# Patient Record
Sex: Male | Born: 1980 | Race: White | Hispanic: No | Marital: Married | Smoking: Former smoker
Health system: Southern US, Community
[De-identification: ages and names within clinical notes are randomized; demographics above are authoritative.]

## PROBLEM LIST (undated history)

## (undated) DIAGNOSIS — K37 Unspecified appendicitis: Secondary | ICD-10-CM

## (undated) DIAGNOSIS — K0530 Chronic periodontitis, unspecified: Secondary | ICD-10-CM

## (undated) DIAGNOSIS — J45909 Unspecified asthma, uncomplicated: Secondary | ICD-10-CM

## (undated) DIAGNOSIS — K5090 Crohn's disease, unspecified, without complications: Secondary | ICD-10-CM

---

## 2002-03-23 ENCOUNTER — Emergency Department (HOSPITAL_COMMUNITY): Admission: EM | Admit: 2002-03-23 | Discharge: 2002-03-23 | Payer: Self-pay | Admitting: Emergency Medicine

## 2002-07-21 ENCOUNTER — Encounter: Payer: Self-pay | Admitting: Emergency Medicine

## 2002-07-21 ENCOUNTER — Emergency Department (HOSPITAL_COMMUNITY): Admission: EM | Admit: 2002-07-21 | Discharge: 2002-07-21 | Payer: Self-pay | Admitting: Emergency Medicine

## 2002-09-29 ENCOUNTER — Encounter: Payer: Self-pay | Admitting: Emergency Medicine

## 2002-09-29 ENCOUNTER — Emergency Department (HOSPITAL_COMMUNITY): Admission: EM | Admit: 2002-09-29 | Discharge: 2002-09-29 | Payer: Self-pay | Admitting: Emergency Medicine

## 2004-06-20 ENCOUNTER — Emergency Department (HOSPITAL_COMMUNITY): Admission: EM | Admit: 2004-06-20 | Discharge: 2004-06-20 | Payer: Self-pay | Admitting: Emergency Medicine

## 2015-04-23 ENCOUNTER — Encounter (HOSPITAL_COMMUNITY): Payer: Self-pay | Admitting: *Deleted

## 2015-04-23 ENCOUNTER — Emergency Department (HOSPITAL_COMMUNITY): Payer: PRIVATE HEALTH INSURANCE

## 2015-04-23 ENCOUNTER — Observation Stay (HOSPITAL_COMMUNITY): Payer: PRIVATE HEALTH INSURANCE | Admitting: Certified Registered Nurse Anesthetist

## 2015-04-23 ENCOUNTER — Observation Stay (HOSPITAL_COMMUNITY)
Admission: EM | Admit: 2015-04-23 | Discharge: 2015-04-24 | Disposition: A | Discharge status: Home / Self Care | Payer: PRIVATE HEALTH INSURANCE | Attending: General Surgery | Admitting: General Surgery

## 2015-04-23 ENCOUNTER — Encounter (HOSPITAL_COMMUNITY)
Admission: EM | Disposition: A | Discharge status: Home / Self Care | Payer: Self-pay | Source: Home / Self Care | Attending: Emergency Medicine

## 2015-04-23 DIAGNOSIS — E860 Dehydration: Secondary | ICD-10-CM | POA: Insufficient documentation

## 2015-04-23 DIAGNOSIS — K3580 Unspecified acute appendicitis: Principal | ICD-10-CM | POA: Diagnosis present

## 2015-04-23 DIAGNOSIS — F17210 Nicotine dependence, cigarettes, uncomplicated: Secondary | ICD-10-CM | POA: Insufficient documentation

## 2015-04-23 DIAGNOSIS — R109 Unspecified abdominal pain: Secondary | ICD-10-CM

## 2015-04-23 DIAGNOSIS — Z880 Allergy status to penicillin: Secondary | ICD-10-CM | POA: Insufficient documentation

## 2015-04-23 HISTORY — PX: LAPAROSCOPIC APPENDECTOMY: SHX408

## 2015-04-23 HISTORY — DX: Unspecified asthma, uncomplicated: J45.909

## 2015-04-23 HISTORY — DX: Chronic periodontitis, unspecified: K05.30

## 2015-04-23 HISTORY — PX: APPENDECTOMY: SHX54

## 2015-04-23 LAB — COMPREHENSIVE METABOLIC PANEL
ALBUMIN: 4.5 g/dL (ref 3.5–5.0)
ALT: 16 U/L — AB (ref 17–63)
ANION GAP: 12 (ref 5–15)
AST: 20 U/L (ref 15–41)
Alkaline Phosphatase: 65 U/L (ref 38–126)
BUN: 15 mg/dL (ref 6–20)
CHLORIDE: 95 mmol/L — AB (ref 101–111)
CO2: 29 mmol/L (ref 22–32)
CREATININE: 0.96 mg/dL (ref 0.61–1.24)
Calcium: 9.7 mg/dL (ref 8.9–10.3)
Glucose, Bld: 130 mg/dL — ABNORMAL HIGH (ref 65–99)
POTASSIUM: 3.1 mmol/L — AB (ref 3.5–5.1)
SODIUM: 136 mmol/L (ref 135–145)
Total Bilirubin: 0.6 mg/dL (ref 0.3–1.2)
Total Protein: 7.3 g/dL (ref 6.5–8.1)

## 2015-04-23 LAB — CBC
HCT: 42.1 % (ref 39.0–52.0)
HEMOGLOBIN: 15 g/dL (ref 13.0–17.0)
MCH: 33.9 pg (ref 26.0–34.0)
MCHC: 35.6 g/dL (ref 30.0–36.0)
MCV: 95 fL (ref 78.0–100.0)
PLATELETS: 194 10*3/uL (ref 150–400)
RBC: 4.43 MIL/uL (ref 4.22–5.81)
RDW: 12.7 % (ref 11.5–15.5)
WBC: 17.3 10*3/uL — AB (ref 4.0–10.5)

## 2015-04-23 LAB — URINALYSIS, ROUTINE W REFLEX MICROSCOPIC
Bilirubin Urine: NEGATIVE
GLUCOSE, UA: NEGATIVE mg/dL
Hgb urine dipstick: NEGATIVE
Ketones, ur: 15 mg/dL — AB
LEUKOCYTES UA: NEGATIVE
Nitrite: NEGATIVE
PROTEIN: 30 mg/dL — AB
SPECIFIC GRAVITY, URINE: 1.023 (ref 1.005–1.030)
pH: 7 (ref 5.0–8.0)

## 2015-04-23 LAB — URINE MICROSCOPIC-ADD ON
RBC / HPF: NONE SEEN RBC/hpf (ref 0–5)
SQUAMOUS EPITHELIAL / LPF: NONE SEEN

## 2015-04-23 LAB — I-STAT CG4 LACTIC ACID, ED
LACTIC ACID, VENOUS: 3.05 mmol/L — AB (ref 0.5–2.0)
LACTIC ACID, VENOUS: 1.32 mmol/L (ref 0.5–2.0)

## 2015-04-23 LAB — LIPASE, BLOOD: LIPASE: 23 U/L (ref 11–51)

## 2015-04-23 SURGERY — APPENDECTOMY, LAPAROSCOPIC
Anesthesia: General

## 2015-04-23 MED ORDER — ONDANSETRON HCL 4 MG/2ML IJ SOLN
INTRAMUSCULAR | Status: DC | PRN
  Administered 2015-04-23: 4 mg via INTRAVENOUS

## 2015-04-23 MED ORDER — PHENYLEPHRINE HCL 10 MG/ML IJ SOLN
10.0000 mg | INTRAVENOUS | Status: DC | PRN
  Administered 2015-04-23: 25 ug/min via INTRAVENOUS

## 2015-04-23 MED ORDER — SUCCINYLCHOLINE CHLORIDE 20 MG/ML IJ SOLN
INTRAMUSCULAR | Status: DC | PRN
  Administered 2015-04-23: 140 mg via INTRAVENOUS

## 2015-04-23 MED ORDER — ONDANSETRON HCL 4 MG/2ML IJ SOLN: 4.0000 mg | Freq: Four times a day (QID) | INTRAMUSCULAR | Status: DC | PRN

## 2015-04-23 MED ORDER — SODIUM CHLORIDE 0.9 % IV BOLUS (SEPSIS)
1000.0000 mL | Freq: Once | INTRAVENOUS | Status: AC
  Administered 2015-04-23: 1000 mL via INTRAVENOUS

## 2015-04-23 MED ORDER — FENTANYL CITRATE (PF) 250 MCG/5ML IJ SOLN
INTRAMUSCULAR | Status: AC
  Filled 2015-04-23: qty 5

## 2015-04-23 MED ORDER — ENOXAPARIN SODIUM 40 MG/0.4ML ~~LOC~~ SOLN
40.0000 mg | SUBCUTANEOUS | Status: DC
  Filled 2015-04-23: qty 0.4

## 2015-04-23 MED ORDER — MIDAZOLAM HCL 2 MG/2ML IJ SOLN
INTRAMUSCULAR | Status: AC
  Filled 2015-04-23: qty 2

## 2015-04-23 MED ORDER — EPHEDRINE SULFATE 50 MG/ML IJ SOLN
INTRAMUSCULAR | Status: DC | PRN
  Administered 2015-04-23: 5 mg via INTRAVENOUS

## 2015-04-23 MED ORDER — OXYCODONE HCL 5 MG PO TABS
10.0000 mg | ORAL_TABLET | ORAL | Status: DC | PRN
  Administered 2015-04-23: 10 mg via ORAL
  Administered 2015-04-24: 15 mg via ORAL
  Administered 2015-04-24: 10 mg via ORAL
  Filled 2015-04-23: qty 3
  Filled 2015-04-23 (×2): qty 2

## 2015-04-23 MED ORDER — EPHEDRINE SULFATE 50 MG/ML IJ SOLN
INTRAMUSCULAR | Status: AC
  Filled 2015-04-23: qty 1

## 2015-04-23 MED ORDER — POTASSIUM CHLORIDE 10 MEQ/100ML IV SOLN
10.0000 meq | INTRAVENOUS | Status: AC
Start: 2015-04-23 — End: 2015-04-23

## 2015-04-23 MED ORDER — DIPHENHYDRAMINE HCL 25 MG PO CAPS: 25.0000 mg | ORAL_CAPSULE | Freq: Four times a day (QID) | ORAL | Status: DC | PRN

## 2015-04-23 MED ORDER — HYDROMORPHONE HCL 1 MG/ML IJ SOLN
0.2500 mg | INTRAMUSCULAR | Status: DC | PRN
  Administered 2015-04-23 (×2): 0.5 mg via INTRAVENOUS

## 2015-04-23 MED ORDER — PHENYLEPHRINE 40 MCG/ML (10ML) SYRINGE FOR IV PUSH (FOR BLOOD PRESSURE SUPPORT)
PREFILLED_SYRINGE | INTRAVENOUS | Status: AC
  Filled 2015-04-23: qty 10

## 2015-04-23 MED ORDER — LIDOCAINE HCL (CARDIAC) 20 MG/ML IV SOLN
INTRAVENOUS | Status: DC | PRN
  Administered 2015-04-23: 40 mg via INTRAVENOUS

## 2015-04-23 MED ORDER — MORPHINE SULFATE (PF) 4 MG/ML IV SOLN
4.0000 mg | Freq: Once | INTRAVENOUS | Status: AC
  Administered 2015-04-23: 4 mg via INTRAVENOUS
  Filled 2015-04-23: qty 1

## 2015-04-23 MED ORDER — ONDANSETRON HCL 4 MG/2ML IJ SOLN
INTRAMUSCULAR | Status: AC
  Filled 2015-04-23: qty 2

## 2015-04-23 MED ORDER — IOHEXOL 300 MG/ML  SOLN
100.0000 mL | Freq: Once | INTRAMUSCULAR | Status: AC | PRN
  Administered 2015-04-23: 100 mL via INTRAVENOUS

## 2015-04-23 MED ORDER — GLYCOPYRROLATE 0.2 MG/ML IJ SOLN
INTRAMUSCULAR | Status: DC | PRN
  Administered 2015-04-23: .4 mg via INTRAVENOUS

## 2015-04-23 MED ORDER — DIPHENHYDRAMINE HCL 50 MG/ML IJ SOLN: 25.0000 mg | Freq: Four times a day (QID) | INTRAMUSCULAR | Status: DC | PRN

## 2015-04-23 MED ORDER — DEXAMETHASONE SODIUM PHOSPHATE 4 MG/ML IJ SOLN
INTRAMUSCULAR | Status: AC
  Filled 2015-04-23: qty 1

## 2015-04-23 MED ORDER — PROMETHAZINE HCL 25 MG/ML IJ SOLN: 6.2500 mg | INTRAMUSCULAR | Status: DC | PRN

## 2015-04-23 MED ORDER — ARTIFICIAL TEARS OP OINT
TOPICAL_OINTMENT | OPHTHALMIC | Status: AC
  Filled 2015-04-23: qty 3.5

## 2015-04-23 MED ORDER — PHENYLEPHRINE HCL 10 MG/ML IJ SOLN
INTRAMUSCULAR | Status: DC | PRN
  Administered 2015-04-23 (×5): 80 ug via INTRAVENOUS

## 2015-04-23 MED ORDER — ONDANSETRON 4 MG PO TBDP: 4.0000 mg | ORAL_TABLET | Freq: Four times a day (QID) | ORAL | Status: DC | PRN

## 2015-04-23 MED ORDER — LACTATED RINGERS IV SOLN
INTRAVENOUS | Status: DC
  Administered 2015-04-24: 01:00:00 via INTRAVENOUS

## 2015-04-23 MED ORDER — SUCCINYLCHOLINE CHLORIDE 20 MG/ML IJ SOLN
INTRAMUSCULAR | Status: AC
  Filled 2015-04-23: qty 1

## 2015-04-23 MED ORDER — NEOSTIGMINE METHYLSULFATE 10 MG/10ML IV SOLN
INTRAVENOUS | Status: DC | PRN
  Administered 2015-04-23: 3 mg via INTRAVENOUS

## 2015-04-23 MED ORDER — POTASSIUM CHLORIDE CRYS ER 20 MEQ PO TBCR
40.0000 meq | EXTENDED_RELEASE_TABLET | Freq: Once | ORAL | Status: AC
  Administered 2015-04-23: 40 meq via ORAL
  Filled 2015-04-23: qty 2

## 2015-04-23 MED ORDER — NEOSTIGMINE METHYLSULFATE 10 MG/10ML IV SOLN
INTRAVENOUS | Status: AC
  Filled 2015-04-23: qty 1

## 2015-04-23 MED ORDER — HYDROMORPHONE HCL 1 MG/ML IJ SOLN
1.0000 mg | INTRAMUSCULAR | Status: DC | PRN
  Administered 2015-04-23: 1 mg via INTRAVENOUS
  Filled 2015-04-23: qty 1

## 2015-04-23 MED ORDER — METRONIDAZOLE IN NACL 5-0.79 MG/ML-% IV SOLN
500.0000 mg | Freq: Four times a day (QID) | INTRAVENOUS | Status: DC
  Administered 2015-04-23: 500 mg via INTRAVENOUS
  Filled 2015-04-23 (×3): qty 100

## 2015-04-23 MED ORDER — LIDOCAINE HCL (CARDIAC) 20 MG/ML IV SOLN
INTRAVENOUS | Status: AC
  Filled 2015-04-23: qty 5

## 2015-04-23 MED ORDER — BUPIVACAINE-EPINEPHRINE 0.25% -1:200000 IJ SOLN
INTRAMUSCULAR | Status: DC | PRN
  Administered 2015-04-23: 10 mL

## 2015-04-23 MED ORDER — ONDANSETRON HCL 4 MG/2ML IJ SOLN
4.0000 mg | INTRAMUSCULAR | Status: AC
  Administered 2015-04-23: 4 mg via INTRAVENOUS
  Filled 2015-04-23: qty 2

## 2015-04-23 MED ORDER — ROCURONIUM BROMIDE 50 MG/5ML IV SOLN
INTRAVENOUS | Status: AC
  Filled 2015-04-23: qty 1

## 2015-04-23 MED ORDER — LACTATED RINGERS IV SOLN
INTRAVENOUS | Status: DC
  Administered 2015-04-23 (×2): via INTRAVENOUS

## 2015-04-23 MED ORDER — SODIUM CHLORIDE 0.9 % IV BOLUS (SEPSIS): 1000.0000 mL | Freq: Once | INTRAVENOUS | Status: DC

## 2015-04-23 MED ORDER — FENTANYL CITRATE (PF) 100 MCG/2ML IJ SOLN
INTRAMUSCULAR | Status: DC | PRN
  Administered 2015-04-23: 50 ug via INTRAVENOUS
  Administered 2015-04-23 (×2): 100 ug via INTRAVENOUS

## 2015-04-23 MED ORDER — CIPROFLOXACIN IN D5W 400 MG/200ML IV SOLN
400.0000 mg | Freq: Two times a day (BID) | INTRAVENOUS | Status: DC
  Administered 2015-04-23: 400 mg via INTRAVENOUS
  Filled 2015-04-23 (×2): qty 200

## 2015-04-23 MED ORDER — METRONIDAZOLE IN NACL 5-0.79 MG/ML-% IV SOLN
500.0000 mg | Freq: Three times a day (TID) | INTRAVENOUS | Status: DC
  Administered 2015-04-24 (×2): 500 mg via INTRAVENOUS
  Filled 2015-04-23 (×3): qty 100

## 2015-04-23 MED ORDER — ROCURONIUM BROMIDE 100 MG/10ML IV SOLN
INTRAVENOUS | Status: DC | PRN
  Administered 2015-04-23: 20 mg via INTRAVENOUS

## 2015-04-23 MED ORDER — PROPOFOL 10 MG/ML IV BOLUS
INTRAVENOUS | Status: DC | PRN
  Administered 2015-04-23: 180 mg via INTRAVENOUS

## 2015-04-23 MED ORDER — HYDROMORPHONE HCL 1 MG/ML IJ SOLN
1.0000 mg | INTRAMUSCULAR | Status: DC | PRN
  Administered 2015-04-24 (×2): 1 mg via INTRAVENOUS
  Filled 2015-04-23 (×2): qty 1

## 2015-04-23 MED ORDER — HYDROMORPHONE HCL 1 MG/ML IJ SOLN
INTRAMUSCULAR | Status: AC
  Administered 2015-04-23: 0.5 mg via INTRAVENOUS
  Filled 2015-04-23: qty 1

## 2015-04-23 MED ORDER — DIPHENHYDRAMINE HCL 50 MG/ML IJ SOLN
25.0000 mg | Freq: Once | INTRAMUSCULAR | Status: AC
  Administered 2015-04-23: 25 mg via INTRAVENOUS
  Filled 2015-04-23: qty 1

## 2015-04-23 MED ORDER — PROPOFOL 10 MG/ML IV BOLUS
INTRAVENOUS | Status: AC
  Filled 2015-04-23: qty 40

## 2015-04-23 MED ORDER — GLYCOPYRROLATE 0.2 MG/ML IJ SOLN
INTRAMUSCULAR | Status: AC
  Filled 2015-04-23: qty 1

## 2015-04-23 MED ORDER — SODIUM CHLORIDE 0.9 % IJ SOLN
INTRAMUSCULAR | Status: AC
  Filled 2015-04-23: qty 10

## 2015-04-23 MED ORDER — LACTATED RINGERS IV SOLN: INTRAVENOUS | Status: DC

## 2015-04-23 MED ORDER — BUPIVACAINE-EPINEPHRINE (PF) 0.25% -1:200000 IJ SOLN
INTRAMUSCULAR | Status: AC
  Filled 2015-04-23: qty 30

## 2015-04-23 MED ORDER — SODIUM CHLORIDE 0.9 % IR SOLN
Status: DC | PRN
  Administered 2015-04-23: 1000 mL

## 2015-04-23 SURGICAL SUPPLY — 48 items
ADH SKN CLS APL DERMABOND .7 (GAUZE/BANDAGES/DRESSINGS) ×1
APPLIER CLIP ROT 10 11.4 M/L (STAPLE)
APR CLP MED LRG 11.4X10 (STAPLE)
BAG SPEC RTRVL LRG 6X4 10 (ENDOMECHANICALS) ×1
BLADE SURG ROTATE 9660 (MISCELLANEOUS) IMPLANT
CANISTER SUCTION 2500CC (MISCELLANEOUS) ×3 IMPLANT
CHLORAPREP W/TINT 26ML (MISCELLANEOUS) ×3 IMPLANT
CLIP APPLIE ROT 10 11.4 M/L (STAPLE) IMPLANT
CLOSURE WOUND 1/2 X4 (GAUZE/BANDAGES/DRESSINGS) ×1
COVER SURGICAL LIGHT HANDLE (MISCELLANEOUS) ×7 IMPLANT
CUTTER LINEAR ENDO 35 ART FLEX (STAPLE) ×2 IMPLANT
CUTTER LINEAR ENDO 35 ETS (STAPLE) IMPLANT
DERMABOND ADVANCED (GAUZE/BANDAGES/DRESSINGS) ×2
DERMABOND ADVANCED .7 DNX12 (GAUZE/BANDAGES/DRESSINGS) ×1 IMPLANT
DRSG TEGADERM 2-3/8X2-3/4 SM (GAUZE/BANDAGES/DRESSINGS) ×7 IMPLANT
ELECT REM PT RETURN 9FT ADLT (ELECTROSURGICAL) ×3
ELECTRODE REM PT RTRN 9FT ADLT (ELECTROSURGICAL) ×1 IMPLANT
ENDOLOOP SUT PDS II  0 18 (SUTURE)
ENDOLOOP SUT PDS II 0 18 (SUTURE) IMPLANT
GLOVE BIO SURGEON STRL SZ7 (GLOVE) ×2 IMPLANT
GLOVE BIOGEL PI IND STRL 8 (GLOVE) ×1 IMPLANT
GLOVE BIOGEL PI INDICATOR 8 (GLOVE) ×4
GLOVE ECLIPSE 7.5 STRL STRAW (GLOVE) ×7 IMPLANT
GOWN STRL REUS W/ TWL LRG LVL3 (GOWN DISPOSABLE) ×3 IMPLANT
GOWN STRL REUS W/TWL LRG LVL3 (GOWN DISPOSABLE) ×9
KIT BASIN OR (CUSTOM PROCEDURE TRAY) ×3 IMPLANT
KIT ROOM TURNOVER OR (KITS) ×3 IMPLANT
NS IRRIG 1000ML POUR BTL (IV SOLUTION) ×3 IMPLANT
PAD ARMBOARD 7.5X6 YLW CONV (MISCELLANEOUS) ×6 IMPLANT
PENCIL BUTTON HOLSTER BLD 10FT (ELECTRODE) IMPLANT
POUCH SPECIMEN RETRIEVAL 10MM (ENDOMECHANICALS) ×3 IMPLANT
RELOAD /EVU35 (ENDOMECHANICALS) IMPLANT
RELOAD CUTTER ETS 35MM STAND (ENDOMECHANICALS) ×4 IMPLANT
SCALPEL HARMONIC ACE (MISCELLANEOUS) ×3 IMPLANT
SET IRRIG TUBING LAPAROSCOPIC (IRRIGATION / IRRIGATOR) ×3 IMPLANT
SHEARS HARMONIC ACE PLUS 36CM (ENDOMECHANICALS) ×2 IMPLANT
SLEEVE ENDOPATH XCEL 5M (ENDOMECHANICALS) ×5 IMPLANT
SPECIMEN JAR SMALL (MISCELLANEOUS) ×3 IMPLANT
STRIP CLOSURE SKIN 1/2X4 (GAUZE/BANDAGES/DRESSINGS) ×2 IMPLANT
SUT MNCRL AB 4-0 PS2 18 (SUTURE) ×3 IMPLANT
TOWEL OR 17X24 6PK STRL BLUE (TOWEL DISPOSABLE) ×3 IMPLANT
TOWEL OR 17X26 10 PK STRL BLUE (TOWEL DISPOSABLE) ×3 IMPLANT
TRAY FOLEY CATH 16FR SILVER (SET/KITS/TRAYS/PACK) ×3 IMPLANT
TRAY LAPAROSCOPIC MC (CUSTOM PROCEDURE TRAY) ×3 IMPLANT
TROCAR BLADELESS 5MM (ENDOMECHANICALS) ×2 IMPLANT
TROCAR XCEL BLUNT TIP 100MML (ENDOMECHANICALS) ×3 IMPLANT
TROCAR XCEL NON-BLD 5MMX100MML (ENDOMECHANICALS) ×3 IMPLANT
TUBING INSUFFLATION (TUBING) ×3 IMPLANT

## 2015-04-23 NOTE — Op Note (Signed)
OPERATIVE REPORT  DATE OF OPERATION: 04/23/2015  PATIENT:  Paul Arnold  34 y.o. male  PRE-OPERATIVE DIAGNOSIS:  Acute appendicitis  POST-OPERATIVE DIAGNOSIS:  SAME  PROCEDURE:  Procedure(s): APPENDECTOMY LAPAROSCOPIC  SURGEON:  Surgeon(s): Judeth Horn, MD  ASSISTANT: None  ANESTHESIA:   general  EBL: <20 ml  BLOOD ADMINISTERED: none  DRAINS: none   SPECIMEN:  Source of Specimen:  Appendix  COUNTS CORRECT:  YES  PROCEDURE DETAILS: The patient was taken to the operating room and placed on the table in supine position. After an adequate general endotracheal anesthetic was administered he was prepped and draped in usual sterile manner exposing the abdomen.  A proper timeout was performed identifying the patient and procedure to be performed.  A supraumbilical midline incision was made using a #15 blade and taken down to the midline fascia. We entered the peritoneal cavity at the midline fascia securing in place a 12 mm Hassan cannula with a pursestring suture of 0 Vicryl. We insufflated carbon dioxide gas up to a maximal pressure of 15 mmHg. Under direct vision with the laparoscope and attached camera and light source, right upper quadrant and a left low quadrant 5 mm cannula pads put in place. The patient was placed in Trendelenburg and the left side was tilted down.  The patient had a very short appendix which we dissected out at the base a Harmonic Scalpel. We came across the base of the appendix with the blue cartridge Endo GIA, however because of misfiring the appendix was detached but no staple line was placed.  We subsequently had use a second cartridge in order to come across the base of the appendix/cecum and removed the pieces with an Endo Catch bag. There was no appendiceal or fecal spillage during this time.  During the dissection of the appendix there was a serosal tear--not a full thickness enterotomy-- of the cecal wall which we repaired using a blue cartridge  Endo GIA also. This was a sidewall bite which did not cause any full-thickness spillage or injury.  We irrigated with saline solution then close out the super umbilical fascial side using the pursestring suture which was in place. We aspirated all fluid and gas.  We closed the supraumbilical incision with a running subcuticular stitch of 4-0 Monocryl.  0.25% Marcaine with epinephrine was injected all sites. We closed the skin at the super umbilical site using a running subcuticular stitch of 4-0 Monocryl. Dermabond Steri-Strips and Tegaderms are used to complete the dressings. All needle counts, sponge counts, and his main counts were correct.  PATIENT DISPOSITION:  PACU - hemodynamically stable.   [name] 12/5/20166:25 PM

## 2015-04-23 NOTE — ED Notes (Signed)
Dr. Hulen Skains in to assess pt at this time

## 2015-04-23 NOTE — ED Notes (Signed)
Called to triage x 1

## 2015-04-23 NOTE — ED Provider Notes (Signed)
CSN: [id number]     Arrival date & time 04/23/15  0940 History   First MD Initiated Contact with Patient 04/23/15 1016     Chief Complaint  Patient presents with  . Emesis  . Abdominal Pain    HPI  Paul Arnold is a 34 y.o. male with no pertinent PMH who presents to the ED with intermittent abdominal pain, nausea, and vomiting x 6 months. He reports his symptoms have been more severe over the past 3 days. He states eating or drinking exacerbates his symptoms. He has not tried anything for symptom relief. He reports associated decreased appetite, subjective fever, chills, diarrhea. He states he has vomited approximately 20 times in the past 24 hours.   Past Medical History  Diagnosis Date  . Periodontitis    Past Surgical History  Procedure Laterality Date  . No past surgeries     History reviewed. No pertinent family history. Social History  Substance Use Topics  . Smoking status: Current Every Day Smoker -- 0.50 packs/day    Types: Cigarettes  . Smokeless tobacco: None  . Alcohol Use: No      Review of Systems  Constitutional: Positive for fever and chills.  Gastrointestinal: Positive for nausea, vomiting, abdominal pain and diarrhea. Negative for constipation and blood in stool.  Genitourinary: Negative for dysuria, urgency, frequency, discharge, penile swelling, scrotal swelling, penile pain and testicular pain.  All other systems reviewed and are negative.     Allergies  Penicillins  Home Medications   Prior to Admission medications   Medication Sig Start Date End Date Taking? Authorizing Provider  acetaminophen (TYLENOL) 500 MG tablet Take 1,000 mg by mouth every 6 (six) hours as needed for mild pain.   Yes Historical Provider, MD  clindamycin (CLEOCIN) 300 MG capsule Take 300 mg by mouth 3 (three) times daily.   Yes Historical Provider, MD    BP 103/65 mmHg  Pulse 88  Temp(Src) 98.6 F (37 C) (Oral)  Resp 18  SpO2 100% Physical Exam   Constitutional: He is oriented to person, place, and time. He appears well-developed and well-nourished. No distress.  HENT:  Head: Normocephalic and atraumatic.  Right Ear: External ear normal.  Left Ear: External ear normal.  Nose: Nose normal.  Mouth/Throat: Uvula is midline, oropharynx is clear and moist and mucous membranes are normal.  Eyes: Conjunctivae, EOM and lids are normal. Pupils are equal, round, and reactive to light. Right eye exhibits no discharge. Left eye exhibits no discharge. No scleral icterus.  Neck: Normal range of motion. Neck supple.  Cardiovascular: Normal rate, regular rhythm, normal heart sounds, intact distal pulses and normal pulses.   Pulmonary/Chest: Effort normal and breath sounds normal. No respiratory distress. He has no wheezes. He has no rales.  Abdominal: Soft. Normal appearance and bowel sounds are normal. He exhibits no distension and no mass. There is tenderness. There is no rigidity, no rebound and no guarding.  Diffuse TTP. No rebound, guarding, or masses.   Musculoskeletal: Normal range of motion. He exhibits no edema or tenderness.  Neurological: He is alert and oriented to person, place, and time. He has normal strength. No sensory deficit.  Skin: Skin is warm, dry and intact. No rash noted. He is not diaphoretic. No erythema. No pallor.  Psychiatric: He has a normal mood and affect. His speech is normal and behavior is normal.  Nursing note and vitals reviewed.   ED Course  Procedures (including critical care time)  Labs Review Labs  Reviewed  COMPREHENSIVE METABOLIC PANEL - Abnormal; Notable for the following:    Potassium 3.1 (*)    Chloride 95 (*)    Glucose, Bld 130 (*)    ALT 16 (*)    All other components within normal limits  CBC - Abnormal; Notable for the following:    WBC 17.3 (*)    All other components within normal limits  URINALYSIS, ROUTINE W REFLEX MICROSCOPIC (NOT AT [facility]) - Abnormal; Notable for the following:     Ketones, ur 15 (*)    Protein, ur 30 (*)    All other components within normal limits  URINE MICROSCOPIC-ADD ON - Abnormal; Notable for the following:    Bacteria, UA RARE (*)    Casts HYALINE CASTS (*)    All other components within normal limits  I-STAT CG4 LACTIC ACID, ED - Abnormal; Notable for the following:    Lactic Acid, Venous 3.05 (*)    All other components within normal limits  LIPASE, BLOOD  I-STAT CG4 LACTIC ACID, ED    Imaging Review Ct Abdomen Pelvis W Contrast  04/23/2015  CLINICAL DATA:  Nausea and vomiting for 6 months with increased over the past 3 days EXAM: CT ABDOMEN AND PELVIS WITH CONTRAST TECHNIQUE: Multidetector CT imaging of the abdomen and pelvis was performed using the standard protocol following bolus administration of intravenous contrast. CONTRAST:  125m OMNIPAQUE IOHEXOL 300 MG/ML  SOLN COMPARISON:  08/11/2012 FINDINGS: Lung bases are free of acute infiltrate or sizable effusion. The liver, gallbladder, spleen, adrenal glands and pancreas are within normal limits. Kidneys are well visualized and reveal a normal enhancement pattern. No renal calculi or obstructive changes are noted. The appendix is well visualized and mildly prominent at 9.5 mm. A small appendicolith is noted within. These changes could represent some very early appendicitis. Correlation with the physical exam is recommended. The bladder is partially distended. No pelvic mass lesion is seen. No free pelvic fluid is noted. The osseous structures are within normal limits. IMPRESSION: Questionable prominence of the appendix. Correlation with the physical exam is recommended. Short-term followup CT may be helpful. No other focal abnormality is noted. Electronically Signed   By: MInez CatalinaM.D.   On: 04/23/2015 15:19     I have personally reviewed and evaluated these images and lab results as part of my medical decision-making.   EKG Interpretation None      MDM   Final diagnoses:   Abdominal pain  Abdominal pain    34year old male presents with abdominal pain, nausea, vomiting for the past 6 months. He reports associated diarrhea, subjective fever, and chills. Patient admits to poor PO intake with acute worsening of his symptoms x 3 days.  Patient is afebrile. BP in 933H-545Gsystolic. Heart regular rate and rhythm. Lungs clear to auscultation bilaterally. Abdomen soft, nondistended, with mild diffuse tenderness to palpation. No rebound, guarding, or masses.  Patient given 1L NS bolus x 2, antiemetic, and pain medication.  CBC remarkable for leukocytosis of 17.3. CMP remarkable for potassium 3.1, repleted in the ED. Lipase within normal limits. Lactic acid elevated at 3.05. Given additional 1L bolus NS. Will obtain CT abdomen pelvis. Imaging remarkable for prominence of the appendix. On reassessment, patient has diffuse TTP, with significant TTP in RLQ. Surgery consulted. Dr. WHulen Skainsevaluated the patient in the ED, patient to go to surgery.  Patient discussed with and seen by Dr. SBilly Fischer  BP 103/65 mmHg  Pulse 88  Temp(Src) 98.6 F (37 C) (Oral)  Resp 18  SpO2 100%   Marella Chimes, PA-C 04/23/15 1943  Gareth Morgan, MD 04/23/15 2241

## 2015-04-23 NOTE — Anesthesia Preprocedure Evaluation (Addendum)
Anesthesia Evaluation  Patient identified by MRN, date of birth, ID band Patient awake    Reviewed: Allergy & Precautions, NPO status , Patient's Chart, lab work & pertinent test results  Airway Mallampati: II  TM Distance: >3 FB Neck ROM: Full    Dental  (+) Poor Dentition, Missing, Loose, Dental Advisory Given, Chipped   Pulmonary Current Smoker,    Pulmonary exam normal        Cardiovascular Normal cardiovascular exam     Neuro/Psych negative neurological ROS  negative psych ROS   GI/Hepatic (+)     substance abuse  ,   Endo/Other  negative endocrine ROS  Renal/GU negative Renal ROS     Musculoskeletal   Abdominal   Peds  Hematology negative hematology ROS (+)   Anesthesia Other Findings   Reproductive/Obstetrics                            Anesthesia Physical Anesthesia Plan  ASA: II and emergent  Anesthesia Plan: General   Post-op Pain Management:    Induction: Intravenous  Airway Management Planned: Oral ETT  Additional Equipment:   Intra-op Plan:   Post-operative Plan: Extubation in OR  Informed Consent: I have reviewed the patients History and Physical, chart, labs and discussed the procedure including the risks, benefits and alternatives for the proposed anesthesia with the patient or authorized representative who has indicated his/her understanding and acceptance.   Dental advisory given  Plan Discussed with: CRNA, Anesthesiologist and Surgeon  Anesthesia Plan Comments:         Anesthesia Quick Evaluation

## 2015-04-23 NOTE — H&P (Signed)
Chief Complaint: nausea, vomiting and abdominal pain  HPI: Paul Arnold is a 34 year old male without significant past medical history presenting with worsening abdominal pain, nausea and vomiting.  He reports intermittent symptoms over the past 6 months, about 2-3 times per week.  It would not be aggravated by oral intake.  Since this time, he has been taking 10-11 ibuprofen per day along with tylenol 4 tablets every 3 hours several times per week.  He reports hematemesis about 6 months ago, but nothing recent.  He reports hematochezia several times noted in the toilet.    Over the last 3 days, he reports worsening pain periumbilical radiating to the right lower quadrant.  Associated with nausea, vomiting, fever, chills, sweats and anorexia.    His work up shows WBC 17.3k, K 3.1, normal renal function, negative UA. CT of abdomen and pelvis "questionable prominence of the appendix.  We have therefore been asked to evaluate.   History reviewed. No pertinent past medical history.  History reviewed. No pertinent past surgical history.  History reviewed. No pertinent family history. Social History:  reports that he has been smoking Cigarettes.  He does not have any smokeless tobacco history on file. He reports that he does not drink alcohol or use illicit drugs.  Allergies:  Allergies  Allergen Reactions  . Penicillins     Childhood allergy Has patient had a PCN reaction causing immediate rash, facial/tongue/throat swelling, SOB or lightheadedness with hypotension: unknown Has patient had a PCN reaction causing severe rash involving mucus membranes or skin necrosis: unknown Has patient had a PCN reaction that required hospitalization unknown Has patient had a PCN reaction occurring within the last 10 years: unknown If all of the above answers are "NO", then may proceed with Cephalosporin use.      (Not in a hospital admission)  Results for orders placed or performed during the  hospital encounter of 04/23/15 (from the past 48 hour(s))  Lipase, blood     Status: None   Collection Time: 04/23/15 10:50 AM  Result Value Ref Range   Lipase 23 11 - 51 U/L  Comprehensive metabolic panel     Status: Abnormal   Collection Time: 04/23/15 10:50 AM  Result Value Ref Range   Sodium 136 135 - 145 mmol/L   Potassium 3.1 (L) 3.5 - 5.1 mmol/L   Chloride 95 (L) 101 - 111 mmol/L   CO2 29 22 - 32 mmol/L   Glucose, Bld 130 (H) 65 - 99 mg/dL   BUN 15 6 - 20 mg/dL   Creatinine, Ser 0.96 0.61 - 1.24 mg/dL   Calcium 9.7 8.9 - 10.3 mg/dL   Total Protein 7.3 6.5 - 8.1 g/dL   Albumin 4.5 3.5 - 5.0 g/dL   AST 20 15 - 41 U/L   ALT 16 (L) 17 - 63 U/L   Alkaline Phosphatase 65 38 - 126 U/L   Total Bilirubin 0.6 0.3 - 1.2 mg/dL   GFR calc non Af Amer >60 >60 mL/min   GFR calc Af Amer >60 >60 mL/min    Comment: (NOTE) The eGFR has been calculated using the CKD EPI equation. This calculation has not been validated in all clinical situations. eGFR's persistently <60 mL/min signify possible Chronic Kidney Disease.    Anion gap 12 5 - 15  CBC     Status: Abnormal   Collection Time: 04/23/15 10:50 AM  Result Value Ref Range   WBC 17.3 (H) 4.0 - 10.5 K/uL   RBC  4.43 4.22 - 5.81 MIL/uL   Hemoglobin 15.0 13.0 - 17.0 g/dL   HCT 42.1 39.0 - 52.0 %   MCV 95.0 78.0 - 100.0 fL   MCH 33.9 26.0 - 34.0 pg   MCHC 35.6 30.0 - 36.0 g/dL   RDW 12.7 11.5 - 15.5 %   Platelets 194 150 - 400 K/uL  I-Stat CG4 Lactic Acid, ED     Status: Abnormal   Collection Time: 04/23/15 12:04 PM  Result Value Ref Range   Lactic Acid, Venous 3.05 (HH) 0.5 - 2.0 mmol/L   Comment NOTIFIED PHYSICIAN   Urinalysis, Routine w reflex microscopic (not at [facility])     Status: Abnormal   Collection Time: 04/23/15  2:40 PM  Result Value Ref Range   Color, Urine YELLOW YELLOW   APPearance CLEAR CLEAR   Specific Gravity, Urine 1.023 1.005 - 1.030   pH 7.0 5.0 - 8.0   Glucose, UA NEGATIVE NEGATIVE mg/dL   Hgb urine dipstick  NEGATIVE NEGATIVE   Bilirubin Urine NEGATIVE NEGATIVE   Ketones, ur 15 (A) NEGATIVE mg/dL   Protein, ur 30 (A) NEGATIVE mg/dL   Nitrite NEGATIVE NEGATIVE   Leukocytes, UA NEGATIVE NEGATIVE  Urine microscopic-add on     Status: Abnormal   Collection Time: 04/23/15  2:40 PM  Result Value Ref Range   Squamous Epithelial / LPF NONE SEEN NONE SEEN   WBC, UA 0-5 0 - 5 WBC/hpf   RBC / HPF NONE SEEN 0 - 5 RBC/hpf   Bacteria, UA RARE (A) NONE SEEN   Casts HYALINE CASTS (A) NEGATIVE   Urine-Other MUCOUS PRESENT    Ct Abdomen Pelvis W Contrast  04/23/2015  CLINICAL DATA:  Nausea and vomiting for 6 months with increased over the past 3 days EXAM: CT ABDOMEN AND PELVIS WITH CONTRAST TECHNIQUE: Multidetector CT imaging of the abdomen and pelvis was performed using the standard protocol following bolus administration of intravenous contrast. CONTRAST:  1110m OMNIPAQUE IOHEXOL 300 MG/ML  SOLN COMPARISON:  08/11/2012 FINDINGS: Lung bases are free of acute infiltrate or sizable effusion. The liver, gallbladder, spleen, adrenal glands and pancreas are within normal limits. Kidneys are well visualized and reveal a normal enhancement pattern. No renal calculi or obstructive changes are noted. The appendix is well visualized and mildly prominent at 9.5 mm. A small appendicolith is noted within. These changes could represent some very early appendicitis. Correlation with the physical exam is recommended. The bladder is partially distended. No pelvic mass lesion is seen. No free pelvic fluid is noted. The osseous structures are within normal limits. IMPRESSION: Questionable prominence of the appendix. Correlation with the physical exam is recommended. Short-term followup CT may be helpful. No other focal abnormality is noted. Electronically Signed   By: MInez CatalinaM.D.   On: 04/23/2015 15:19    Review of Systems  Constitutional: Positive for fever, chills, malaise/fatigue and diaphoresis. Negative for weight loss.   Eyes: Negative for blurred vision, double vision, photophobia, pain, discharge and redness.  Respiratory: Negative for cough, hemoptysis, sputum production, shortness of breath and wheezing.   Cardiovascular: Negative for chest pain, palpitations, orthopnea, claudication, leg swelling and PND.  Gastrointestinal: Positive for nausea, vomiting, abdominal pain and blood in stool. Negative for heartburn, diarrhea, constipation and melena.  Genitourinary: Positive for dysuria and flank pain. Negative for urgency, frequency and hematuria.  Musculoskeletal: Positive for back pain. Negative for myalgias.  Neurological: Negative for dizziness, tingling, tremors, sensory change, speech change, focal weakness, seizures, loss of consciousness,  weakness and headaches.  Psychiatric/Behavioral: Negative for substance abuse.    Blood pressure 95/59, pulse 92, temperature 98.1 F (36.7 C), temperature source Oral, resp. rate 18, SpO2 98 %. Physical Exam  Constitutional: He is oriented to person, place, and time. He appears well-developed and well-nourished. No distress.  Cardiovascular: Normal rate, regular rhythm, normal heart sounds and intact distal pulses.  Exam reveals no gallop and no friction rub.   No murmur heard. Respiratory: Effort normal and breath sounds normal. No respiratory distress. He has no wheezes. He has no rales. He exhibits no tenderness.  GI: Soft. Bowel sounds are normal. He exhibits no distension and no mass.  Diffusely tender, most appreciated RLQ. Diffuse voluntary guarding  Musculoskeletal: Normal range of motion. He exhibits no edema or tenderness.  Neurological: He is alert and oriented to person, place, and time.  Skin: Skin is warm and dry. He is not diaphoretic.  Urticaria   Psychiatric: He has a normal mood and affect. His behavior is normal. Judgment and thought content normal.     Assessment/Plan Appendicitis-proceed with laparoscopic appendectomy.  Surgical risks  discussed including infection, bleeding, injury to surrounding structures, anesthesia risks.  He understands that this may not alleviate his symptoms.  He verbalizes understanding and wishes to proceed. ID-cipro/flagyl Dehydration- lactic acid 3.05--->1.32.  IVF VTE prophylaxis-SCD, post op lovenox  FEN-NPO, give IVF bolus.  Hypokalemia-give 76mq IV now Dispo-to OR  Paul Arnold ANP-BC 04/23/2015, 3:46 PM

## 2015-04-23 NOTE — ED Notes (Signed)
Pt reports n/v and and pain x 6 months, more severe x 3 days and reports intermittent diarrhea over the past 6 months. Reports dehydration, not urinating x 1 day.

## 2015-04-23 NOTE — Transfer of Care (Signed)
Immediate Anesthesia Transfer of Care Note  Patient: Paul Arnold  Procedure(s) Performed: Procedure(s): APPENDECTOMY LAPAROSCOPIC (N/A)  Patient Location: PACU  Anesthesia Type:General  Level of Consciousness: awake, alert  and oriented  Airway & Oxygen Therapy: Patient Spontanous Breathing  Post-op Assessment: Report given to RN and Post -op Vital signs reviewed and stable  Post vital signs: Reviewed and stable  Last Vitals:  Filed Vitals:   04/23/15 1430 04/23/15 1615  BP: 95/59 103/65  Pulse: 92 88  Temp:  37 C  Resp:  18    Complications: No apparent anesthesia complications

## 2015-04-23 NOTE — Anesthesia Procedure Notes (Signed)
Procedure Name: Intubation Date/Time: 04/23/2015 5:26 PM Performed by: Merdis Delay Pre-anesthesia Checklist: Patient identified, Timeout performed, Emergency Drugs available, Suction available and Patient being monitored Patient Re-evaluated:Patient Re-evaluated prior to inductionOxygen Delivery Method: Circle system utilized Preoxygenation: Pre-oxygenation with 100% oxygen Intubation Type: IV induction and Rapid sequence Laryngoscope Size: Mac and 3 Grade View: Grade I Tube type: Oral Tube size: 7.5 mm Number of attempts: 1 Placement Confirmation: ETT inserted through vocal cords under direct vision,  breath sounds checked- equal and bilateral,  CO2 detector and positive ETCO2 Secured at: 22 cm Tube secured with: Tape Dental Injury: Teeth and Oropharynx as per pre-operative assessment

## 2015-04-23 NOTE — ED Notes (Signed)
OR consent signed by pt before pain meds given

## 2015-04-24 ENCOUNTER — Encounter (HOSPITAL_COMMUNITY): Payer: Self-pay | Admitting: General Practice

## 2015-04-24 MED ORDER — OXYCODONE HCL 10 MG PO TABS: 10.0000 mg | ORAL_TABLET | ORAL | Status: DC | PRN

## 2015-04-24 NOTE — Anesthesia Postprocedure Evaluation (Signed)
Anesthesia Post Note  Patient: Paul Arnold  Procedure(s) Performed: Procedure(s) (LRB): APPENDECTOMY LAPAROSCOPIC (N/A)  Patient location during evaluation: PACU Anesthesia Type: General Level of consciousness: sedated and patient cooperative Pain management: pain level controlled Vital Signs Assessment: post-procedure vital signs reviewed and stable Respiratory status: spontaneous breathing Cardiovascular status: stable Anesthetic complications: no    Last Vitals:  Filed Vitals:   04/24/15 0154 04/24/15 0535  BP: 111/63 101/68  Pulse: 83 81  Temp: 37 C 36.8 C  Resp: 12 11    Last Pain:  Filed Vitals:   04/24/15 0535  PainSc: Asleep                 Nolon Nations

## 2015-04-24 NOTE — Discharge Summary (Signed)
Physician Discharge Summary  Patient ID: Paul Arnold MRN: [id number] DOB/AGE: 1981-01-31 34 y.o.  Admit date: 04/23/2015 Discharge date: 04/24/2015  Admitting Diagnosis: Appendicitis   Discharge Diagnosis Patient Active Problem List   Diagnosis Date Noted  . Laparoscopic appendectomy 04/23/2015    Consultants None  Imaging: Ct Abdomen Pelvis W Contrast  04/23/2015  CLINICAL DATA:  Nausea and vomiting for 6 months with increased over the past 3 days EXAM: CT ABDOMEN AND PELVIS WITH CONTRAST TECHNIQUE: Multidetector CT imaging of the abdomen and pelvis was performed using the standard protocol following bolus administration of intravenous contrast. CONTRAST:  175m OMNIPAQUE IOHEXOL 300 MG/ML  SOLN COMPARISON:  08/11/2012 FINDINGS: Lung bases are free of acute infiltrate or sizable effusion. The liver, gallbladder, spleen, adrenal glands and pancreas are within normal limits. Kidneys are well visualized and reveal a normal enhancement pattern. No renal calculi or obstructive changes are noted. The appendix is well visualized and mildly prominent at 9.5 mm. A small appendicolith is noted within. These changes could represent some very early appendicitis. Correlation with the physical exam is recommended. The bladder is partially distended. No pelvic mass lesion is seen. No free pelvic fluid is noted. The osseous structures are within normal limits. IMPRESSION: Questionable prominence of the appendix. Correlation with the physical exam is recommended. Short-term followup CT may be helpful. No other focal abnormality is noted. Electronically Signed   By: Paul CatalinaM.D.   On: 04/23/2015 15:19    Procedures Laparoscopic appendectomy  Reason for admission:  Paul Swamyis a 34year old male without significant past medical history presenting with worsening abdominal pain, nausea and vomiting. He reports intermittent symptoms over the past 6 months, about 2-3 times per week. It would  not be aggravated by oral intake. Since this time, he has been taking 10-11 ibuprofen per day along with tylenol 4 tablets every 3 hours several times per week. He reports hematemesis about 6 months ago, but nothing recent. He reports hematochezia several times noted in the toilet.  Over the last 3 days, he reports worsening pain periumbilical radiating to the right lower quadrant. Associated with nausea, vomiting, fever, chills, sweats and anorexia.  His work up shows WBC 17.3k, K 3.1, normal renal function, negative UA. CT of abdomen and pelvis "questionable prominence of the appendix. We have therefore been asked to evaluate   Hospital Course:  Patient was admitted and underwent procedure listed above.  The significant leukocytosis and pain may not have been related to the appendix.  The appendix did not look significantly inflamed. He may need further work up by PCP or GI if the symptoms he has been having for 6 months persist. On POD#1, he was sore, but previous symptoms had actually resolved. The patient was voiding well, tolerating diet, ambulating well, pain well controlled, vital signs stable, incisions c/d/i and felt stable for discharge home. Medication risks, benefits and therapeutic alternatives were reviewed with the patient.  He verbalizes understanding.  Patient will follow up in our office in 3 weeks and knows to call with questions or concerns.  Physical Exam: General:  Alert, NAD, pleasant, comfortable Abd:  Soft, ND, mild tenderness, incisions C/D/I    Medication List    TAKE these medications        acetaminophen 500 MG tablet  Commonly known as:  TYLENOL  Take 1,000 mg by mouth every 6 (six) hours as needed for mild pain.     clindamycin 300 MG capsule  Commonly known  as:  CLEOCIN  Take 300 mg by mouth 3 (three) times daily.     Oxycodone HCl 10 MG Tabs  Take 1-1.5 tablets (10-15 mg total) by mouth every 4 (four) hours as needed for moderate pain or severe  pain.             Follow-up Information    Follow up with [facility] On 05/16/2015.   Specialty:  General Surgery   Why:  arrive by 8AM for a 8:30AM post operative check up   Contact information:   [address] [place] [address] [phone]       Signed: Erby Pian, [facility] [phone]  04/24/2015, 8:38 AM

## 2015-04-24 NOTE — Discharge Instructions (Signed)
REMOVE THE CLEAR DRESSING FROM YOUR INCISIONS IN THE SHOWER ON Thursday OR Friday.  LEAVE THE STRIPS IN PLACE.  THEY WILL FALL OFF ON THEIR OWN.  IF THEY COME OFF WHILE YOURE TAKING THE CLEAR DRESSING OFF, ITS OKAY, THERE IS NOTHING YOU NEED TO DO FOR THIS.  YOU MAY SHOWER, BUT NO SOAKING IN A BATHTUB.  IF YOU DEVELOP RECURRENCE OF THE PAIN YOU HAVE BEEN HAVING OVER THE LAST 6 MONTHS, YOU NEED TO SEE A PRIMARY CARE DOCTOR FOR FURTHER WORK UP.   DO NOT TAKE ANY IBUPROFEN/ADVIL/MOTRIN    LAPAROSCOPIC SURGERY: POST OP INSTRUCTIONS  1. DIET: Follow a light bland diet the first 24 hours after arrival home, such as soup, liquids, crackers, etc.  Be sure to include lots of fluids daily.  Avoid fast food or heavy meals as your are more likely to get nauseated.  Eat a low fat the next few days after surgery.   2. Take your usually prescribed home medications unless otherwise directed. 3. PAIN CONTROL: a. Pain is best controlled by a usual combination of three different methods TOGETHER: i. Ice/Heat ii. Over the counter pain medication iii. Prescription pain medication b. Most patients will experience some swelling and bruising around the incisions.  Ice packs or heating pads (30-60 minutes up to 6 times a day) will help. Use ice for the first few days to help decrease swelling and bruising, then switch to heat to help relax tight/sore spots and speed recovery.  Some people prefer to use ice alone, heat alone, alternating between ice & heat.  Experiment to what works for you.  Swelling and bruising can take several weeks to resolve.   c. It is helpful to take an over-the-counter pain medication regularly for the first few weeks.  Choose one of the following that works best for you: i. Naproxen (Aleve, etc)  Two 275m tabs twice a day ii. Ibuprofen (Advil, etc) Three 2030mtabs four times a day (every meal & bedtime) iii. Acetaminophen (Tylenol, etc) 500-65031mour times a day (every meal &  bedtime) d. A  prescription for pain medication (such as oxycodone, hydrocodone, etc) should be given to you upon discharge.  Take your pain medication as prescribed.  i. If you are having problems/concerns with the prescription medicine (does not control pain, nausea, vomiting, rash, itching, etc), please call us [phone] see if we need to switch you to a different pain medicine that will work better for you and/or control your side effect better. ii. If you need a refill on your pain medication, please contact your pharmacy.  They will contact our office to request authorization. Prescriptions will not be filled after 5 pm or on week-ends. 4. Avoid getting constipated.  Between the surgery and the pain medications, it is common to experience some constipation.  Increasing fluid intake and taking a fiber supplement (such as Metamucil, Citrucel, FiberCon, MiraLax, etc) 1-2 times a day regularly will usually help prevent this problem from occurring.  A mild laxative (prune juice, Milk of Magnesia, MiraLax, etc) should be taken according to package directions if there are no bowel movements after 48 hours.   5. Watch out for diarrhea.  If you have many loose bowel movements, simplify your diet to bland foods & liquids for a few days.  Stop any stool softeners and decrease your fiber supplement.  Switching to mild anti-diarrheal medications (Kayopectate, Pepto Bismol) can help.  If this worsens or does not improve, please call us.[place]  6. Wash / shower every day.  You may shower over the dressings as they are waterproof.  Continue to shower over incision(s) after the dressing is off. 7. Remove your waterproof bandages 5 days after surgery.  You may leave the incision open to air.  You may replace a dressing/Band-Aid to cover the incision for comfort if you wish.  8. ACTIVITIES as tolerated:   a. You may resume regular (light) daily activities beginning the next day--such as daily self-care, walking,  climbing stairs--gradually increasing activities as tolerated.  If you can walk 30 minutes without difficulty, it is safe to try more intense activity such as jogging, treadmill, bicycling, low-impact aerobics, swimming, etc. b. Save the most intensive and strenuous activity for last such as sit-ups, heavy lifting, contact sports, etc  Refrain from any heavy lifting or straining until you are off narcotics for pain control.   c. DO NOT PUSH THROUGH PAIN.  Let pain be your guide: If it hurts to do something, don't do it.  Pain is your body warning you to avoid that activity for another week until the pain goes down. d. You may drive when you are no longer taking prescription pain medication, you can comfortably wear a seatbelt, and you can safely maneuver your car and apply brakes. e. Dennis Bast may have sexual intercourse when it is comfortable.  9. FOLLOW UP in our office a. Please call [facility] at [phone] to set up an appointment to see your surgeon in the office for a follow-up appointment approximately 2-3 weeks after your surgery. b. Make sure that you call for this appointment the day you arrive home to insure a convenient appointment time. 10. IF YOU HAVE DISABILITY OR FAMILY LEAVE FORMS, BRING THEM TO THE OFFICE FOR PROCESSING.  DO NOT GIVE THEM TO YOUR DOCTOR.   WHEN TO CALL us [phone]: 1. Poor pain control 2. Reactions / problems with new medications (rash/itching, nausea, etc)  3. Fever over 101.5 F (38.5 C) 4. Inability to urinate 5. Nausea and/or vomiting 6. Worsening swelling or bruising 7. Continued bleeding from incision. 8. Increased pain, redness, or drainage from the incision   The clinic staff is available to answer your questions during regular business hours (8:30am-5pm).  Please dont hesitate to call and ask to speak to one of our nurses for clinical concerns.   If you have a medical emergency, go to the nearest emergency room or call 911.  A surgeon from [facility] is always on call at the [facility], [place], [place], [place], [place]  [address] ? MAIN: [phone] ? TOLL FREE: [phone] ?  FAX [phone] [email or web address]

## 2015-05-01 ENCOUNTER — Encounter (HOSPITAL_COMMUNITY): Payer: Self-pay | Admitting: Emergency Medicine

## 2015-05-01 ENCOUNTER — Emergency Department (HOSPITAL_COMMUNITY)
Admission: EM | Admit: 2015-05-01 | Discharge: 2015-05-01 | Disposition: A | Discharge status: Home / Self Care | Payer: PRIVATE HEALTH INSURANCE | Attending: Emergency Medicine | Admitting: Emergency Medicine

## 2015-05-01 DIAGNOSIS — Z9089 Acquired absence of other organs: Secondary | ICD-10-CM | POA: Diagnosis not present

## 2015-05-01 DIAGNOSIS — R1110 Vomiting, unspecified: Secondary | ICD-10-CM | POA: Diagnosis not present

## 2015-05-01 DIAGNOSIS — Z8719 Personal history of other diseases of the digestive system: Secondary | ICD-10-CM | POA: Diagnosis not present

## 2015-05-01 DIAGNOSIS — Z792 Long term (current) use of antibiotics: Secondary | ICD-10-CM | POA: Diagnosis not present

## 2015-05-01 DIAGNOSIS — R1031 Right lower quadrant pain: Secondary | ICD-10-CM | POA: Insufficient documentation

## 2015-05-01 DIAGNOSIS — J45909 Unspecified asthma, uncomplicated: Secondary | ICD-10-CM | POA: Diagnosis not present

## 2015-05-01 DIAGNOSIS — R197 Diarrhea, unspecified: Secondary | ICD-10-CM | POA: Insufficient documentation

## 2015-05-01 DIAGNOSIS — R1032 Left lower quadrant pain: Secondary | ICD-10-CM | POA: Diagnosis not present

## 2015-05-01 DIAGNOSIS — F17210 Nicotine dependence, cigarettes, uncomplicated: Secondary | ICD-10-CM | POA: Diagnosis not present

## 2015-05-01 DIAGNOSIS — Z880 Allergy status to penicillin: Secondary | ICD-10-CM | POA: Diagnosis not present

## 2015-05-01 DIAGNOSIS — G8918 Other acute postprocedural pain: Secondary | ICD-10-CM | POA: Insufficient documentation

## 2015-05-01 DIAGNOSIS — R109 Unspecified abdominal pain: Secondary | ICD-10-CM

## 2015-05-01 HISTORY — DX: Unspecified appendicitis: K37

## 2015-05-01 LAB — LIPASE, BLOOD: Lipase: 34 U/L (ref 11–51)

## 2015-05-01 LAB — COMPREHENSIVE METABOLIC PANEL
ALBUMIN: 3.4 g/dL — AB (ref 3.5–5.0)
ALT: 15 U/L — AB (ref 17–63)
AST: 12 U/L — AB (ref 15–41)
Alkaline Phosphatase: 70 U/L (ref 38–126)
Anion gap: 9 (ref 5–15)
BUN: 6 mg/dL (ref 6–20)
CHLORIDE: 99 mmol/L — AB (ref 101–111)
CO2: 29 mmol/L (ref 22–32)
CREATININE: 0.84 mg/dL (ref 0.61–1.24)
Calcium: 9 mg/dL (ref 8.9–10.3)
GFR calc Af Amer: >60 mL/min (ref >60)
GFR calc non Af Amer: >60 mL/min (ref >60)
Glucose, Bld: 100 mg/dL — ABNORMAL HIGH (ref 65–99)
Potassium: 3.5 mmol/L (ref 3.5–5.1)
SODIUM: 137 mmol/L (ref 135–145)
Total Bilirubin: 0.4 mg/dL (ref 0.3–1.2)
Total Protein: 6.5 g/dL (ref 6.5–8.1)

## 2015-05-01 LAB — CBC WITH DIFFERENTIAL/PLATELET
BASOS ABS: 0 10*3/uL (ref 0.0–0.1)
BASOS PCT: 1 %
EOS ABS: 0.2 10*3/uL (ref 0.0–0.7)
EOS PCT: 2 %
HCT: 41.2 % (ref 39.0–52.0)
Hemoglobin: 14 g/dL (ref 13.0–17.0)
LYMPHS PCT: 24 %
Lymphs Abs: 2.1 10*3/uL (ref 0.7–4.0)
MCH: 32.9 pg (ref 26.0–34.0)
MCHC: 34 g/dL (ref 30.0–36.0)
MCV: 96.7 fL (ref 78.0–100.0)
Monocytes Absolute: 1 10*3/uL (ref 0.1–1.0)
Monocytes Relative: 11 %
Neutro Abs: 5.3 10*3/uL (ref 1.7–7.7)
Neutrophils Relative %: 62 %
PLATELETS: 263 10*3/uL (ref 150–400)
RBC: 4.26 MIL/uL (ref 4.22–5.81)
RDW: 12.6 % (ref 11.5–15.5)
WBC: 8.6 10*3/uL (ref 4.0–10.5)

## 2015-05-01 MED ORDER — ONDANSETRON HCL 4 MG PO TABS: 4.0000 mg | ORAL_TABLET | Freq: Four times a day (QID) | ORAL | Status: DC

## 2015-05-01 MED ORDER — MORPHINE SULFATE (PF) 4 MG/ML IV SOLN
4.0000 mg | Freq: Once | INTRAVENOUS | Status: AC
  Administered 2015-05-01: 4 mg via INTRAVENOUS
  Filled 2015-05-01: qty 1

## 2015-05-01 MED ORDER — ONDANSETRON HCL 4 MG/2ML IJ SOLN
4.0000 mg | Freq: Once | INTRAMUSCULAR | Status: AC
  Administered 2015-05-01: 4 mg via INTRAVENOUS

## 2015-05-01 MED ORDER — SODIUM CHLORIDE 0.9 % IV BOLUS (SEPSIS)
1000.0000 mL | Freq: Once | INTRAVENOUS | Status: AC
  Administered 2015-05-01: 1000 mL via INTRAVENOUS

## 2015-05-01 MED ORDER — HYDROCODONE-ACETAMINOPHEN 5-325 MG PO TABS: 2.0000 | ORAL_TABLET | ORAL | Status: DC | PRN

## 2015-05-01 MED ORDER — SODIUM CHLORIDE 0.9 % IV BOLUS (SEPSIS): 1000.0000 mL | Freq: Once | INTRAVENOUS | Status: DC

## 2015-05-01 MED ORDER — FENTANYL CITRATE (PF) 100 MCG/2ML IJ SOLN
100.0000 ug | Freq: Once | INTRAMUSCULAR | Status: AC
  Administered 2015-05-01: 100 ug via INTRAVENOUS
  Filled 2015-05-01: qty 2

## 2015-05-01 MED ORDER — ONDANSETRON HCL 4 MG/2ML IJ SOLN
4.0000 mg | Freq: Once | INTRAMUSCULAR | Status: DC
  Filled 2015-05-01: qty 2

## 2015-05-01 MED ORDER — FENTANYL CITRATE (PF) 100 MCG/2ML IJ SOLN: 100.0000 ug | INTRAMUSCULAR | Status: DC | PRN

## 2015-05-01 MED ORDER — SODIUM CHLORIDE 0.9 % IV SOLN: INTRAVENOUS | Status: DC

## 2015-05-01 NOTE — ED Provider Notes (Signed)
CSN: [id number]     Arrival date & time 05/01/15  0915 History   First MD Initiated Contact with Patient 05/01/15 [phone]     Chief Complaint  Patient presents with  . Post-op Problem     (Consider location/radiation/quality/duration/timing/severity/associated sxs/prior Treatment) HPI   Paul Arnold is a 34 y.o. male who presents for evaluation of bilateral lower quadrant abdominal pain, since he had surgery for appendicitis, one week ago. The pain is persistent and accompanied by vomiting for 2 days and diarrhea for 3 days. He has not been able to eat much. He has not seen blood in the emesis or stool. He has felt warm but has not had documented temperature above 100.4. The surgery was done laparoscopically, without complications. He denies cough, shortness of breath, weakness or dizziness. There are no other known modifying factors.   Past Medical History  Diagnosis Date  . Periodontitis   . Asthma   . Appendicitis    Past Surgical History  Procedure Laterality Date  . Appendectomy  04/23/2015    laproscopic   . Laparoscopic appendectomy N/A 04/23/2015    Procedure: APPENDECTOMY LAPAROSCOPIC;  Surgeon: Judeth Horn, MD;  Location: [facility];  Service: General;  Laterality: N/A;   No family history on file. Social History  Substance Use Topics  . Smoking status: Current Every Day Smoker -- 0.50 packs/day for 10 years    Types: Cigarettes  . Smokeless tobacco: Never Used  . Alcohol Use: No    Review of Systems  All other systems reviewed and are negative.     Allergies  Contrast media and Penicillins  Home Medications   Prior to Admission medications   Medication Sig Start Date End Date Taking? Authorizing Provider  acetaminophen (TYLENOL) 500 MG tablet Take 1,000 mg by mouth every 6 (six) hours as needed for mild pain.    Historical Provider, MD  clindamycin (CLEOCIN) 300 MG capsule Take 300 mg by mouth 3 (three) times daily.    Historical Provider, MD  oxyCODONE 10  MG TABS Take 1-1.5 tablets (10-15 mg total) by mouth every 4 (four) hours as needed for moderate pain or severe pain. 04/24/15   Emina Riebock, NP   BP 111/71 mmHg  Pulse 65  Temp(Src) 98.9 F (37.2 C) (Oral)  Resp 16  Ht 5' 9"  (1.753 m)  Wt 130 lb (58.968 kg)  BMI 19.19 kg/m2  SpO2 97% Physical Exam  Constitutional: He is oriented to person, place, and time. He appears well-developed and well-nourished.  HENT:  Head: Normocephalic and atraumatic.  Right Ear: External ear normal.  Left Ear: External ear normal.  Eyes: Conjunctivae and EOM are normal. Pupils are equal, round, and reactive to light.  Neck: Normal range of motion and phonation normal. Neck supple.  Cardiovascular: Normal rate, regular rhythm and normal heart sounds.   Pulmonary/Chest: Effort normal and breath sounds normal. He exhibits no bony tenderness.  Abdominal: Soft. There is no tenderness.  Laparoscopic surgical wounds appear to be healing very well without evidence for Raynaud's, redness, bleeding or dehiscence. There are sutures present in the superior abdominal wound. The abdomen is tender diffusely in the lower quadrants bilaterally, left greater than right. The bowel sounds are normal.  Musculoskeletal: Normal range of motion.  Neurological: He is alert and oriented to person, place, and time. No cranial nerve deficit or sensory deficit. He exhibits normal muscle tone. Coordination normal.  Skin: Skin is warm, dry and intact.  Psychiatric: He has a normal mood  and affect. His behavior is normal. Judgment and thought content normal.  Nursing note and vitals reviewed.   ED Course  Procedures (including critical care time)  Medications  sodium chloride 0.9 % bolus 1,000 mL (not administered)  0.9 %  sodium chloride infusion (not administered)  fentaNYL (SUBLIMAZE) injection 100 mcg (not administered)  ondansetron (ZOFRAN) injection 4 mg (not administered)  sodium chloride 0.9 % bolus 1,000 mL (1,000 mLs  Intravenous New Bag/Given 05/01/15 1058)  morphine 4 MG/ML injection 4 mg (4 mg Intravenous Given 05/01/15 1059)  ondansetron (ZOFRAN) injection 4 mg (4 mg Intravenous Given 05/01/15 1058)    Patient Vitals for the past 24 hrs:  BP Temp Temp src Pulse Resp SpO2 Height Weight  05/01/15 1130 111/71 mmHg - - 65 16 97 % - -  05/01/15 0935 111/71 mmHg 98.9 F (37.2 C) Oral 72 18 99 % 5' 9"  (1.753 m) 130 lb (58.968 kg)       Labs Review Labs Reviewed  COMPREHENSIVE METABOLIC PANEL - Abnormal; Notable for the following:    Chloride 99 (*)    Glucose, Bld 100 (*)    Albumin 3.4 (*)    AST 12 (*)    ALT 15 (*)    All other components within normal limits  CBC WITH DIFFERENTIAL/PLATELET  LIPASE, BLOOD  URINALYSIS, ROUTINE W REFLEX MICROSCOPIC (NOT AT [facility])    Imaging Review No results found. I have personally reviewed and evaluated these images and lab results as part of my medical decision-making.   EKG Interpretation None      MDM   Final diagnoses:  Postoperative abdominal pain    Nonspecific postoperative pain. Doubt intra-abdominal abscess, metabolic instability or impending vascular collapse.   Nursing Notes Reviewed/ Care Coordinated Applicable Imaging Reviewed Interpretation of Laboratory Data incorporated into ED treatment  The patient appears reasonably screened and/or stabilized for discharge and I doubt any other medical condition or other [facility] requiring further screening, evaluation, or treatment in the ED at this time prior to discharge.  Plan: Home Medications- Norco; Home Treatments- rest; return here if the recommended treatment, does not improve the symptoms; Recommended follow up- Gen. Surg. 2-4 days   Daleen Bo, MD 05/01/15 [phone]

## 2015-05-01 NOTE — ED Notes (Signed)
Provider ordered patient PO challenge given crackers, water, and sprite.

## 2015-05-01 NOTE — ED Notes (Signed)
Provider at bedside

## 2015-05-01 NOTE — Discharge Instructions (Signed)
Mr. BRANSYN ADAMI,  Nice meeting you! Please follow-up with Dr. Hulen Skains for suture removal and surgery followup. Return to the emergency department if you develop fevers, have increasing abdominal pain, have trouble keeping foods down. Feel better soon!  S. Wendie Simmer, PA-C

## 2015-05-01 NOTE — ED Notes (Signed)
Had surgery 12/6 for appendicitis-- went home the next day-- Saturday started feeling bad-- started having diarrhea Friday evening. Severe pain in abd.

## 2015-05-10 ENCOUNTER — Emergency Department (HOSPITAL_COMMUNITY)
Admission: EM | Admit: 2015-05-10 | Discharge: 2015-05-10 | Disposition: A | Discharge status: Home / Self Care | Payer: No Typology Code available for payment source | Attending: Emergency Medicine | Admitting: Emergency Medicine

## 2015-05-10 ENCOUNTER — Emergency Department (HOSPITAL_COMMUNITY): Payer: No Typology Code available for payment source

## 2015-05-10 ENCOUNTER — Encounter (HOSPITAL_COMMUNITY): Payer: Self-pay

## 2015-05-10 DIAGNOSIS — R112 Nausea with vomiting, unspecified: Secondary | ICD-10-CM | POA: Insufficient documentation

## 2015-05-10 DIAGNOSIS — Z880 Allergy status to penicillin: Secondary | ICD-10-CM | POA: Insufficient documentation

## 2015-05-10 DIAGNOSIS — Z9049 Acquired absence of other specified parts of digestive tract: Secondary | ICD-10-CM | POA: Diagnosis not present

## 2015-05-10 DIAGNOSIS — R109 Unspecified abdominal pain: Secondary | ICD-10-CM

## 2015-05-10 DIAGNOSIS — J45909 Unspecified asthma, uncomplicated: Secondary | ICD-10-CM | POA: Diagnosis not present

## 2015-05-10 DIAGNOSIS — F17210 Nicotine dependence, cigarettes, uncomplicated: Secondary | ICD-10-CM | POA: Insufficient documentation

## 2015-05-10 DIAGNOSIS — Z792 Long term (current) use of antibiotics: Secondary | ICD-10-CM | POA: Diagnosis not present

## 2015-05-10 DIAGNOSIS — R1084 Generalized abdominal pain: Secondary | ICD-10-CM | POA: Diagnosis present

## 2015-05-10 DIAGNOSIS — Z8719 Personal history of other diseases of the digestive system: Secondary | ICD-10-CM | POA: Diagnosis not present

## 2015-05-10 LAB — URINALYSIS, ROUTINE W REFLEX MICROSCOPIC
BILIRUBIN URINE: NEGATIVE
GLUCOSE, UA: NEGATIVE mg/dL
HGB URINE DIPSTICK: NEGATIVE
Ketones, ur: NEGATIVE mg/dL
Leukocytes, UA: NEGATIVE
Nitrite: NEGATIVE
PROTEIN: NEGATIVE mg/dL
SPECIFIC GRAVITY, URINE: 1.013 (ref 1.005–1.030)
pH: 8 (ref 5.0–8.0)

## 2015-05-10 LAB — COMPREHENSIVE METABOLIC PANEL
ALBUMIN: 4 g/dL (ref 3.5–5.0)
ALT: 14 U/L — AB (ref 17–63)
AST: 12 U/L — AB (ref 15–41)
Alkaline Phosphatase: 67 U/L (ref 38–126)
Anion gap: 9 (ref 5–15)
BILIRUBIN TOTAL: 0.4 mg/dL (ref 0.3–1.2)
BUN: 12 mg/dL (ref 6–20)
CO2: 26 mmol/L (ref 22–32)
CREATININE: 0.65 mg/dL (ref 0.61–1.24)
Calcium: 8.9 mg/dL (ref 8.9–10.3)
Chloride: 104 mmol/L (ref 101–111)
GFR calc Af Amer: >60 mL/min (ref >60)
GLUCOSE: 98 mg/dL (ref 65–99)
POTASSIUM: 4.1 mmol/L (ref 3.5–5.1)
Sodium: 139 mmol/L (ref 135–145)
TOTAL PROTEIN: 6.8 g/dL (ref 6.5–8.1)

## 2015-05-10 LAB — CBC WITH DIFFERENTIAL/PLATELET
Basophils Absolute: 0.1 10*3/uL (ref 0.0–0.1)
Basophils Relative: 1 %
EOS PCT: 1 %
Eosinophils Absolute: 0.1 10*3/uL (ref 0.0–0.7)
HEMATOCRIT: 40.2 % (ref 39.0–52.0)
Hemoglobin: 13.7 g/dL (ref 13.0–17.0)
LYMPHS ABS: 2.2 10*3/uL (ref 0.7–4.0)
LYMPHS PCT: 25 %
MCH: 32.8 pg (ref 26.0–34.0)
MCHC: 34.1 g/dL (ref 30.0–36.0)
MCV: 96.2 fL (ref 78.0–100.0)
MONO ABS: 0.6 10*3/uL (ref 0.1–1.0)
Monocytes Relative: 7 %
NEUTROS ABS: 5.6 10*3/uL (ref 1.7–7.7)
Neutrophils Relative %: 66 %
Platelets: 233 10*3/uL (ref 150–400)
RBC: 4.18 MIL/uL — AB (ref 4.22–5.81)
RDW: 12.9 % (ref 11.5–15.5)
WBC: 8.6 10*3/uL (ref 4.0–10.5)

## 2015-05-10 LAB — POC OCCULT BLOOD, ED: FECAL OCCULT BLD: NEGATIVE

## 2015-05-10 MED ORDER — DIPHENHYDRAMINE HCL 50 MG/ML IJ SOLN
50.0000 mg | Freq: Once | INTRAMUSCULAR | Status: AC
  Administered 2015-05-10: 50 mg via INTRAVENOUS
  Filled 2015-05-10: qty 1

## 2015-05-10 MED ORDER — ONDANSETRON 8 MG PO TBDP: ORAL_TABLET | ORAL | Status: DC

## 2015-05-10 MED ORDER — ONDANSETRON HCL 4 MG/2ML IJ SOLN
4.0000 mg | Freq: Once | INTRAMUSCULAR | Status: AC
  Administered 2015-05-10: 4 mg via INTRAVENOUS
  Filled 2015-05-10: qty 2

## 2015-05-10 MED ORDER — FENTANYL CITRATE (PF) 100 MCG/2ML IJ SOLN
50.0000 ug | INTRAMUSCULAR | Status: DC | PRN
  Administered 2015-05-10: 50 ug via INTRAVENOUS
  Filled 2015-05-10: qty 2

## 2015-05-10 MED ORDER — METHYLPREDNISOLONE SODIUM SUCC 125 MG IJ SOLR
125.0000 mg | Freq: Once | INTRAMUSCULAR | Status: AC
  Administered 2015-05-10: 125 mg via INTRAVENOUS
  Filled 2015-05-10: qty 2

## 2015-05-10 MED ORDER — IOHEXOL 300 MG/ML  SOLN
150.0000 mL | Freq: Once | INTRAMUSCULAR | Status: AC | PRN
  Administered 2015-05-10: 80 mL via INTRAVENOUS

## 2015-05-10 MED ORDER — POLYETHYLENE GLYCOL 3350 17 GM/SCOOP PO POWD: 17.0000 g | Freq: Two times a day (BID) | ORAL | Status: DC

## 2015-05-10 MED ORDER — BARIUM SULFATE 2.1 % PO SUSP
450.0000 mL | Freq: Once | ORAL | Status: AC
  Administered 2015-05-10: 450 mL via ORAL

## 2015-05-10 MED ORDER — PROMETHAZINE HCL 25 MG PO TABS: 25.0000 mg | ORAL_TABLET | Freq: Four times a day (QID) | ORAL | Status: DC | PRN

## 2015-05-10 MED ORDER — SODIUM CHLORIDE 0.9 % IV BOLUS (SEPSIS)
2000.0000 mL | Freq: Once | INTRAVENOUS | Status: AC
  Administered 2015-05-10: 2000 mL via INTRAVENOUS

## 2015-05-10 MED ORDER — PANTOPRAZOLE SODIUM 40 MG IV SOLR
40.0000 mg | Freq: Once | INTRAVENOUS | Status: AC
  Administered 2015-05-10: 40 mg via INTRAVENOUS
  Filled 2015-05-10: qty 40

## 2015-05-10 NOTE — ED Notes (Signed)
Pt. Is unable to give urine sample at this time.

## 2015-05-10 NOTE — ED Notes (Signed)
Patient d/c'd selfcare.  F/U and medications discussed.  Patient and family member verbalized understanding.

## 2015-05-10 NOTE — Progress Notes (Signed)
[facility] ED CM noted pt with coverage but no pcp listed Spoke with pt who confirms no pcp  [facility] ED CM spoke with pt on how to obtain an in network pcp with insurance coverage via the customer service number or web site  Cm reviewed ED level of care for crisis/emergent services and community pcp level of care to manage continuous or chronic medical concerns.  The pt voiced understanding CM encouraged pt and discussed pt's responsibility to verify with pt's insurance carrier that any recommended medical provider offered by any emergency room or a hospital provider is within the carrier's network. The pt voiced understanding

## 2015-05-10 NOTE — ED Notes (Signed)
Patient states that has eaten 4 crackers and drank 1/3 of ginger ale.  Stomach is aching but patient has not vomited.

## 2015-05-10 NOTE — ED Notes (Signed)
Attempted IV, this RN unsuccessful.  Asking Alaina N to attempt.

## 2015-05-10 NOTE — Discharge Instructions (Signed)
1. Medications: zofran, Phenergan, MiraLAX, usual home medications 2. Treatment: rest, drink plenty of fluids, advance diet slowly 3. Follow Up: Please followup with your primary doctor and/or gastroenterology in 2 days for discussion of your diagnoses and further evaluation after today's visit; if you do not have a primary care doctor use the resource guide provided to find one; Please return to the ER for persistent vomiting, high fevers or worsening symptoms   Constipation, Adult Constipation is when a person has fewer than three bowel movements a week, has difficulty having a bowel movement, or has stools that are dry, hard, or larger than normal. As people grow older, constipation is more common. A low-fiber diet, not taking in enough fluids, and taking certain medicines may make constipation worse.  CAUSES   Certain medicines, such as antidepressants, pain medicine, iron supplements, antacids, and water pills.   Certain diseases, such as diabetes, irritable bowel syndrome (IBS), thyroid disease, or depression.   Not drinking enough water.   Not eating enough fiber-rich foods.   Stress or travel.   Lack of physical activity or exercise.   Ignoring the urge to have a bowel movement.   Using laxatives too much.  SIGNS AND SYMPTOMS   Having fewer than three bowel movements a week.   Straining to have a bowel movement.   Having stools that are hard, dry, or larger than normal.   Feeling full or bloated.   Pain in the lower abdomen.   Not feeling relief after having a bowel movement.  DIAGNOSIS  Your health care provider will take a medical history and perform a physical exam. Further testing may be done for severe constipation. Some tests may include:  A barium enema X-ray to examine your rectum, colon, and, sometimes, your small intestine.   A sigmoidoscopy to examine your lower colon.   A colonoscopy to examine your entire colon. TREATMENT  Treatment  will depend on the severity of your constipation and what is causing it. Some dietary treatments include drinking more fluids and eating more fiber-rich foods. Lifestyle treatments may include regular exercise. If these diet and lifestyle recommendations do not help, your health care provider may recommend taking over-the-counter laxative medicines to help you have bowel movements. Prescription medicines may be prescribed if over-the-counter medicines do not work.  HOME CARE INSTRUCTIONS   Eat foods that have a lot of fiber, such as fruits, vegetables, whole grains, and beans.  Limit foods high in fat and processed sugars, such as french fries, hamburgers, cookies, candies, and soda.   A fiber supplement may be added to your diet if you cannot get enough fiber from foods.   Drink enough fluids to keep your urine clear or pale yellow.   Exercise regularly or as directed by your health care provider.   Go to the restroom when you have the urge to go. Do not hold it.   Only take over-the-counter or prescription medicines as directed by your health care provider. Do not take other medicines for constipation without talking to your health care provider first.  Paul Arnold IF:   You have bright red blood in your stool.   Your constipation lasts for more than 4 days or gets worse.   You have abdominal or rectal pain.   You have thin, pencil-like stools.   You have unexplained weight loss. MAKE SURE YOU:   Understand these instructions.  Will watch your condition.  Will get help right away if you are not  doing well or get worse.   This information is not intended to replace advice given to you by your health care provider. Make sure you discuss any questions you have with your health care provider.   Document Released: 02/01/2004 Document Revised: 05/26/2014 Document Reviewed: 02/14/2013 Elsevier Interactive Patient Education 2016 Paul Arnold.   Emergency  Department Resource Guide 1) Find a Doctor and Pay Out of Pocket Although you won't have to find out who is covered by your insurance plan, it is a good idea to ask around and get recommendations. You will then need to call the office and see if the doctor you have chosen will accept you as a new patient and what types of options they offer for patients who are self-pay. Some doctors offer discounts or will set up payment plans for their patients who do not have insurance, but you will need to ask so you aren't surprised when you get to your appointment.  2) Contact Your Local Health Department Not all health departments have doctors that can see patients for sick visits, but many do, so it is worth a call to see if yours does. If you don't know where your local health department is, you can check in your phone book. The CDC also has a tool to help you locate your state's health department, and many state websites also have listings of all of their local health departments.  3) Find a [facility] If your illness is not likely to be very severe or complicated, you may want to try a walk in clinic. These are popping up all over the country in pharmacies, drugstores, and shopping centers. They're usually staffed by nurse practitioners or physician assistants that have been trained to treat common illnesses and complaints. They're usually fairly quick and inexpensive. However, if you have serious medical issues or chronic medical problems, these are probably not your best option.  No Primary Care Doctor: - Call Health Connect at  [phone] - they can help you locate a primary care doctor that  accepts your insurance, provides certain services, etc. - Physician Referral Service- [phone]  Chronic Pain Problems: Organization         Address  Phone   Notes  [facility]  [phone] Patients need to be referred by their primary care doctor.   Medication  Assistance: Organization         Address  Phone   Notes  [facility] Medication [facility] [address] [place], [place] [address] [phone] --Must be a resident of [facility] -- Must have NO insurance coverage whatsoever (no Medicaid/ Medicare, etc.) -- The pt. MUST have a primary care doctor that directs their care regularly and follows them in the community   [facility]ist  [phone]   [employer]  [phone]    Agencies that provide inexpensive medical care: Organization         Address  Phone   Notes  [place]  [phone]   [facility]    [phone]   [facility] [place], [place] [address] [phone]   [place] [address], [place] [phone]   Planned Parenthood    [phone]   [facility]    [phone]   [place], [place] Phone:  [phone], Fax:  [phone]  Hours of Operation:  9 am - 6 pm, M-F.  Also accepts Medicaid/Medicare and self-pay.  [facility] [place] for [place] [address], [address], [place] Phone: [phone], Fax: [phone]. Hours of Operation:  8:30 am - 5:30 pm, M-F.  Also accepts Medicaid and self-pay.  [facility] [place] [address], [place] Phone: [phone]   [place], [place], [place] [phone], Ext. [phone] Mondays & Thursdays: 7-9 AM.  First 15 patients are seen on a first come, first serve basis.    [place] Providers:  Organization         Address  Phone   Notes  [facility] [address], [place] [phone] Also accepts self-pay patients.  [facility] [address], [place]  [phone]   [place], [address], [place]  [phone]   [facility] [address], [place] [phone]   Lucianne Lei [address], [place]   [phone] Only accepts [place] Access [place] patients after they have their name applied to their card.   Self-Pay (no insurance) in [facility]:  Organization         Address  Phone   Notes  Sickle Cell Patients, [facility] [place] [phone]   [facility] [place] [phone]   [facility] [place]  [place], [place], [place] [phone]   [facility]/Dr. Osei-Bonsu  [address] [place] or [address], [place] [phone] Phone number for both [place] and [place] locations is the same.  Urgent Medical and [facility] [address], [place] [phone]   [facility] [address] [place] or [address] [phone]   [facility] [address] [place] [phone], phone; [phone], fax Sees patients 1st and 3rd Saturday of every month.  Must not qualify for public or private insurance (i.e. Medicaid, Medicare, [place] Health Choice, Veterans' Benefits)  Household income should be no more than 200% of the poverty level The clinic cannot treat you if you are pregnant or think you are pregnant  Sexually transmitted diseases are not treated at the clinic.    Dental Care: Organization         Address  Phone  Notes  [facility] Department of [facility] [place] [phone] Accepts children up to age 59 who are enrolled in [place] or [place]; pregnant women with a Medicaid card; and children who have applied for Medicaid or [place] Health Choice, but were declined, whose parents can pay a reduced fee at time of service.  [facility]  Department of [facility]  [address], [place] [phone] Accepts children up to age 75 who are enrolled in [place] or [place]; pregnant women with a Medicaid card; and children who have applied for Medicaid or [place] Health Choice, but were declined, whose parents can pay a reduced fee at time of service.  [place] Adult Dental Access PROGRAM  [place] [phone] Patients are seen by appointment only. Walk-ins are not accepted. [place] will  see patients 42 years of age and older. Monday - Tuesday (8am-5pm) Most Wednesdays (8:30-5pm) $30 per visit, cash only  [facility] Adult Dental Access PROGRAM  [address], [facility] [phone] Patients are seen by appointment only. Walk-ins are not accepted. [place] will see patients 30 years of age and older. One Wednesday Evening (Monthly: Volunteer Based).  $30 per visit, cash only  [place]  [phone] for adults; Children under age 97, call Graduate Pediatric Dentistry at [phone]. Children aged 70-14, please call [phone] to request a pediatric application.  Dental services are provided in all areas of dental care including fillings, crowns and bridges, complete and partial dentures, implants, gum treatment, root canals, and extractions. Preventive care is also provided. Treatment is provided to both adults and children. Patients are selected via a lottery and there is often a waiting list.   [facility] [address], [place]  [phone] [email or web address]   [facility] [address] [place], [place] [phone], Ext. [phone] Second and Fourth Thursday of each month, opens at 6:30 AM; Clinic ends at 9 AM.  Patients are seen on a first-come first-served basis, and a limited number are seen during each clinic.   [facility]  [address] Hillard Danker [place], [place] [phone]    Eligibility Requirements You must have lived in [place], [place], or [place] counties for at least the last three months.   You cannot be eligible for state or federal sponsored [employer], including [employer], [place], or [employer].   You generally cannot be eligible for healthcare insurance through your employer.    How to apply: Eligibility screenings are held every Tuesday and Wednesday afternoon from 1:00 pm until 4:00 pm. You do not need an appointment for the interview!  [facility] [address] [place], [place]   [place]  [place] Department  [place]  [phone]    Behavioral Health Resources in the Community: Intensive Outpatient Programs Organization         Address  Phone  Notes  [place]. [address], [place], [place] [phone]   [facility] [address], [place], [place]   [facility]: [facility] [address] [place], [place]   [place] [address]  [place], [place] or [phone]   Substance Abuse Resources Organization         Address  Phone  Notes  Alcohol and Drug Services  [phone]   [place]  [phone]   [facility] [place]   Chinita Pester  [phone]   Residential & Outpatient Substance Abuse Program  [phone]   Psychological Services Organization         Address  Phone  Notes  [facility] [place]  [place]  [phone]   [place] [address] [place] or [phone]    Mobile Crisis Teams Organization         Address  Phone  Notes  [facility], Mobile Crisis Care Unit  [phone]   [facility]  [address]  [place], [place]   [facility] [address] [place] [phone]    Self-Help/Support Groups Organization  Address  Phone             Notes  [place]. of [place] - variety of support groups  [place] Call for more information  Narcotics Anonymous (NA), [facility] [address], [employer] [place]  2 meetings at this location   Special educational needs teacher         Address  Phone  Notes  [facility]idential [facility] [place],    [place]  [phone]   [facility]  [address] [place] [address], [place], [place]   [place], [place] [phone] Admissions: 8am-3pm M-F  Incentives Substance [place] [place] [address]    [place], [place] [phone]   [facility] [address] [place], [place], [place]   [facility] [address]  [place], [place]   [facility] Programs - Intensive Outpatient [address] Kristeen [address], [place], [place]   [facility] [place].,  [place], [place] [phone] or [phone]   [facility] Services (RTS) [address], [place], [place] Accepts Medicaid  [facility] [place] [address]  [place] [phone] Substance Abuse/Addiction Treatment   [facility] Organization         Address  Phone  Notes  [facility]  [phone]   Domenic Schwab, PhD [address] [place]lis [place], [place]   [phone] or [phone]   [address] [place], [place] [phone]   [facility] [address], [place], [place] [phone] Insurance/Medicaid/sponsorship through [facility] and Families [address], [address] [place]                                    [place], [place] [phone] [place] [address], [place] [phone]    Dr. Adele Schilder  [phone]   [facility] [place] Dept. 1) [address], [place] 2) [place] 3)  [place] [address], [place] [phone]  [phone]   [place] [phone] or [phone] (After Hours)

## 2015-05-10 NOTE — ED Provider Notes (Signed)
CSN: [id number]     Arrival date & time 05/10/15  6568 History   First MD Initiated Contact with Patient 05/10/15 [phone]     Chief Complaint  Patient presents with  . Abdominal Pain     (Consider location/radiation/quality/duration/timing/severity/associated sxs/prior Treatment) The history is provided by the patient and medical records. No language interpreter was used.     Paul Arnold is a 34 y.o. male  with a hx of asthma presents to the Emergency Department complaining of gradual, persistent, progressively worsening generalized abdominal pain beginning immediately after his appendectomy approximately one week ago. Patient reports he had abdominal pain, nausea and vomiting onset prior to his appendectomy and has persisted after the surgery. He reports that anything he eats he vomits. He reports some weight loss but does not know how much. Patient reports he feels dehydrated and his lips are dry.  Patient reports that he is straining for hard stools with intermittent bright red blood on the toilet paper. He reports he has been consistently taking Vicodin since the surgery but has not taking any stool softeners.  Record review shows that patient was evaluated on 05/01/2015 for bilateral lower quadrant abdominal pain 1 week after laparoscopic appendectomy.  At that time he also had vomiting and diarrhea.   Past Medical History  Diagnosis Date  . Periodontitis   . Appendicitis   . Asthma    Past Surgical History  Procedure Laterality Date  . Appendectomy  04/23/2015    laproscopic   . Laparoscopic appendectomy N/A 04/23/2015    Procedure: APPENDECTOMY LAPAROSCOPIC;  Surgeon: Judeth Horn, MD;  Location: [facility];  Service: General;  Laterality: N/A;   History reviewed. No pertinent family history. Social History  Substance Use Topics  . Smoking status: Current Every Day Smoker -- 0.50 packs/day for 10 years    Types: Cigarettes  . Smokeless tobacco: Never Used  . Alcohol Use: No     Review of Systems  Constitutional: Negative for fever, diaphoresis, appetite change, fatigue and unexpected weight change.  HENT: Negative for mouth sores and trouble swallowing.   Respiratory: Negative for cough, chest tightness, shortness of breath, wheezing and stridor.   Cardiovascular: Negative for chest pain and palpitations.  Gastrointestinal: Positive for nausea, vomiting and abdominal pain. Negative for diarrhea, constipation, blood in stool, abdominal distention and rectal pain.  Genitourinary: Negative for dysuria, urgency, frequency, hematuria, flank pain and difficulty urinating.  Musculoskeletal: Negative for back pain, neck pain and neck stiffness.  Skin: Negative for rash.  Neurological: Negative for weakness.  Hematological: Negative for adenopathy.  Psychiatric/Behavioral: Negative for confusion.  All other systems reviewed and are negative.     Allergies  Contrast media and Penicillins  Home Medications   Prior to Admission medications   Medication Sig Start Date End Date Taking? Authorizing Provider  clindamycin (CLEOCIN) 300 MG capsule Take 300 mg by mouth 3 (three) times daily.   Yes Historical Provider, MD  ondansetron (ZOFRAN ODT) 8 MG disintegrating tablet 26m ODT q4 hours prn nausea 05/10/15   Rahul Malinak, PA-C  polyethylene glycol powder (GLYCOLAX/MIRALAX) powder Take 17 g by mouth 2 (two) times daily. 05/10/15   Davan Nawabi, PA-C  promethazine (PHENERGAN) 25 MG tablet Take 1 tablet (25 mg total) by mouth every 6 (six) hours as needed for nausea or vomiting. 05/10/15   HJarrett SohoMuthersbaugh, PA-C   BP 110/68 mmHg  Pulse 74  Temp(Src) 97.9 F (36.6 C) (Rectal)  Resp 18  SpO2 98% Physical Exam  Constitutional: He is oriented to person, place, and time. He appears well-developed and well-nourished. No distress.  Awake, alert,  Cachectic  HENT:  Head: Normocephalic and atraumatic.  Right Ear: Tympanic membrane, external ear and ear  canal normal.  Left Ear: Tympanic membrane, external ear and ear canal normal.  Nose: Nose normal. No epistaxis. Right sinus exhibits no maxillary sinus tenderness and no frontal sinus tenderness. Left sinus exhibits no maxillary sinus tenderness and no frontal sinus tenderness.  Mouth/Throat: Uvula is midline and oropharynx is clear and moist. Mucous membranes are not pale, dry and not cyanotic. No oropharyngeal exudate, posterior oropharyngeal edema, posterior oropharyngeal erythema or tonsillar abscesses.  Mucous membranes are dry  Eyes: Conjunctivae are normal. Pupils are equal, round, and reactive to light. No scleral icterus.  Neck: Normal range of motion and full passive range of motion without pain. Neck supple.  Cardiovascular: Normal rate, regular rhythm, normal heart sounds and intact distal pulses.   Pulmonary/Chest: Effort normal and breath sounds normal. No stridor. No respiratory distress. He has no wheezes.  Equal chest expansion  Abdominal: Soft. Bowel sounds are normal. He exhibits no distension and no mass. There is no hepatosplenomegaly. There is tenderness. There is guarding. There is no rebound and no CVA tenderness.  Multiple, well-healed laparoscopic surgical incisions without erythema, induration or purulent drainage  Musculoskeletal: Normal range of motion. He exhibits no edema.  Lymphadenopathy:    He has no cervical adenopathy.  Neurological: He is alert and oriented to person, place, and time.  Speech is clear and goal oriented Moves extremities without ataxia  Skin: Skin is warm and dry. No rash noted. He is not diaphoretic.  Psychiatric: He has a normal mood and affect.  Nursing note and vitals reviewed.   ED Course  Procedures (including critical care time) Labs Review Labs Reviewed  CBC WITH DIFFERENTIAL/PLATELET - Abnormal; Notable for the following:    RBC 4.18 (*)    All other components within normal limits  COMPREHENSIVE METABOLIC PANEL - Abnormal;  Notable for the following:    AST 12 (*)    ALT 14 (*)    All other components within normal limits  URINALYSIS, ROUTINE W REFLEX MICROSCOPIC (NOT AT [facility]) - Abnormal; Notable for the following:    APPearance CLOUDY (*)    All other components within normal limits  POC OCCULT BLOOD, ED    Imaging Review Ct Abdomen Pelvis W Contrast  05/10/2015  CLINICAL DATA:  Periumbilical pain radiating to the right lower quadrant. Status post appendectomy 2 weeks ago. Nausea and vomiting for 2 weeks. EXAM: CT ABDOMEN AND PELVIS WITH CONTRAST TECHNIQUE: Multidetector CT imaging of the abdomen and pelvis was performed using the standard protocol following bolus administration of intravenous contrast. CONTRAST:  80 mL OMNIPAQUE IOHEXOL 300 MG/ML  SOLN COMPARISON:  CT abdomen and pelvis 04/23/2015. FINDINGS: The lung bases are clear.  No pleural or pericardial effusion. The gallbladder, liver, spleen, kidneys, adrenal glands and pancreas all appear normal. The stomach and small and large bowel appear normal. The appendix has been removed. No lymphadenopathy or fluid. No bony abnormality is identified. IMPRESSION: Negative CT abdomen and pelvis. No finding to explain the patient's symptoms. Status post appendectomy without evidence of complication. Electronically Signed   By: Inge Rise M.D.   On: 05/10/2015 13:21   Dg Abd Acute W/chest  05/10/2015  CLINICAL DATA:  Status post appendectomy EXAM: DG ABDOMEN ACUTE W/ 1V CHEST COMPARISON:  None FINDINGS: Heart size is normal. No  pleural effusion or edema. No airspace consolidation identified. Interstitial coarsening is noted in both lungs. No dilated loops of small or large bowel noted. IMPRESSION: 1. Nonobstructive bowel gas pattern. 2. No acute cardiopulmonary abnormalities Electronically Signed   By: Kerby Moors M.D.   On: 05/10/2015 12:28   I have personally reviewed and evaluated these images and lab results as part of my medical decision-making.   EKG  Interpretation None      MDM   Final diagnoses:  Abdominal pain, unspecified abdominal location  Non-intractable vomiting with nausea, vomiting of unspecified type  H/O appendicitis   Paul Arnold presents with abdominal pain, nausea and vomiting for several weeks since his appendectomy. Patient appears cachectic with dry mucous membranes.  Patient's labs are reassuring without electrolyte abnormalities or leukocytosis. Fecal or cold is negative. No large hemorrhoids however DRE with large volume of hard stool in the rectal vault.  Acute abdominal series shows nonobstructed bowel gas pattern. Patient with hives and itching after CT scan on 04/23/2015 therefore will premedicate today.  3:14 PM CT scan without evidence of bowel obstruction, abscess or perforation. Repeat abdominal exam is soft without guarding. Patient is eating and drinking here in the emergency department without difficulty. Will discharge home with antiemetics and close GI follow-up.  BP 110/68 mmHg  Pulse 74  Temp(Src) 97.9 F (36.6 C) (Rectal)  Resp 18  SpO2 98%   Abigail Butts, PA-C 05/10/15 1515  Carmin Muskrat, MD 05/10/15 1550

## 2015-05-10 NOTE — ED Notes (Signed)
Patient c/o abdominal pain. States had Appendectomy x2 weeks ago.  Patient states that has nausea and vomiting x2+ weeks.  Difficulty and straining to have BM and notices some blood in stool. Breathing even and unlabored, NAD at this time.

## 2015-05-10 NOTE — ED Notes (Signed)
Patient given crackers and ginger ale.

## 2015-05-10 NOTE — ED Notes (Signed)
Patient transported to CT 

## 2016-07-29 IMAGING — CR DG ABDOMEN ACUTE W/ 1V CHEST
3 series · 3 of 3 positions shown · non-contrast
Comparison: None

CLINICAL DATA: Status post appendectomy

EXAM:
DG ABDOMEN ACUTE W/ 1V CHEST

[w chest pa]
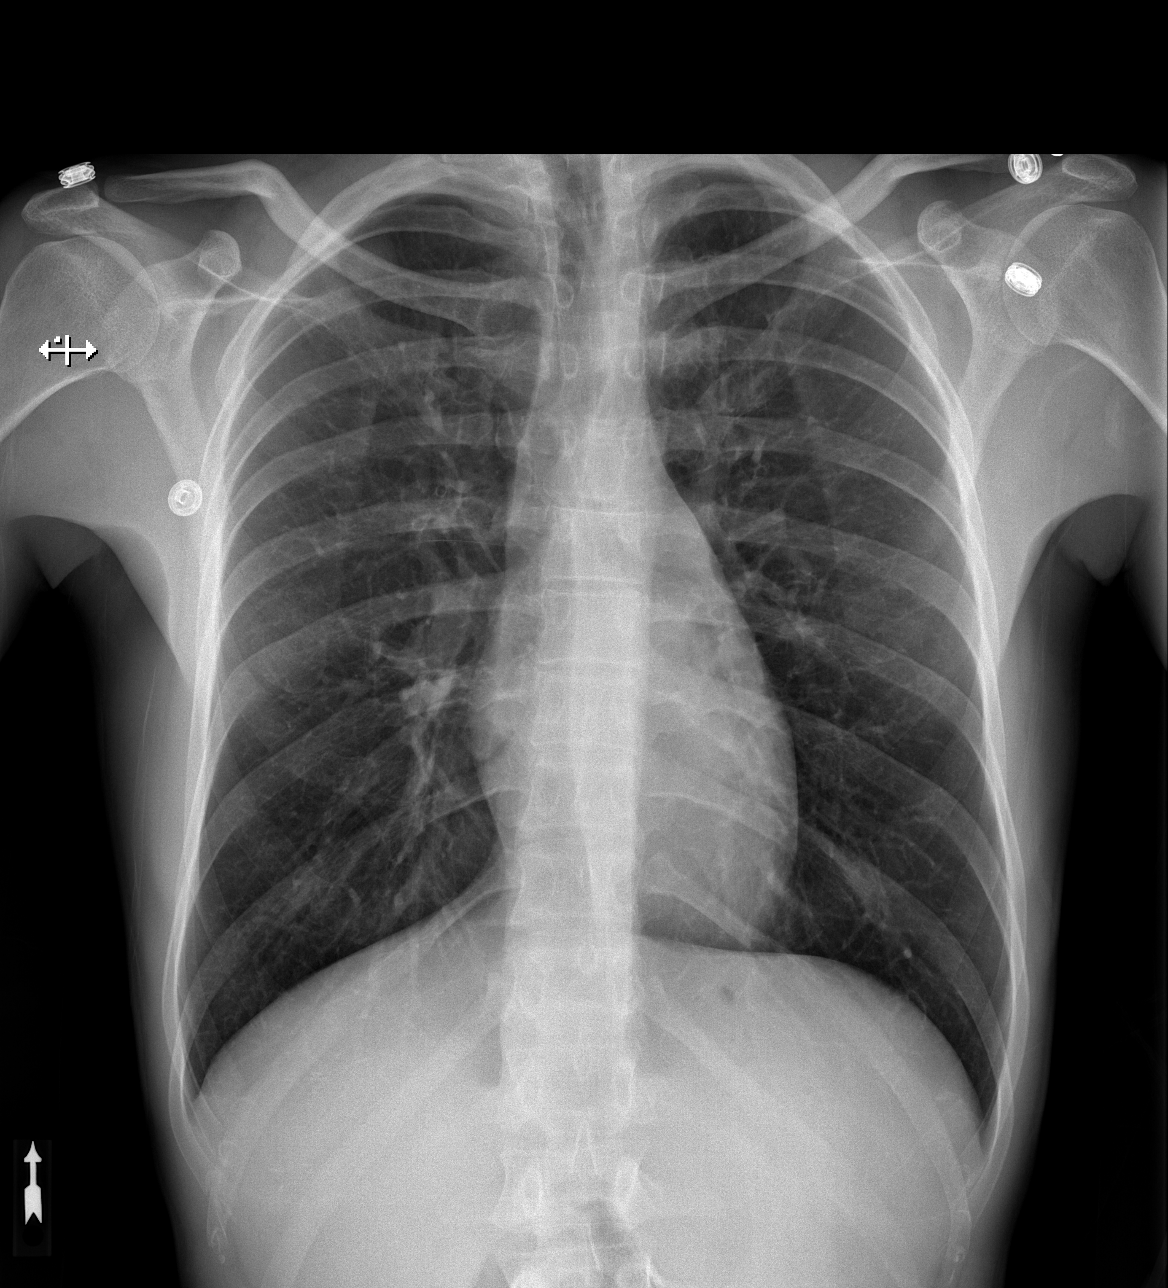

[w abdomen upright]
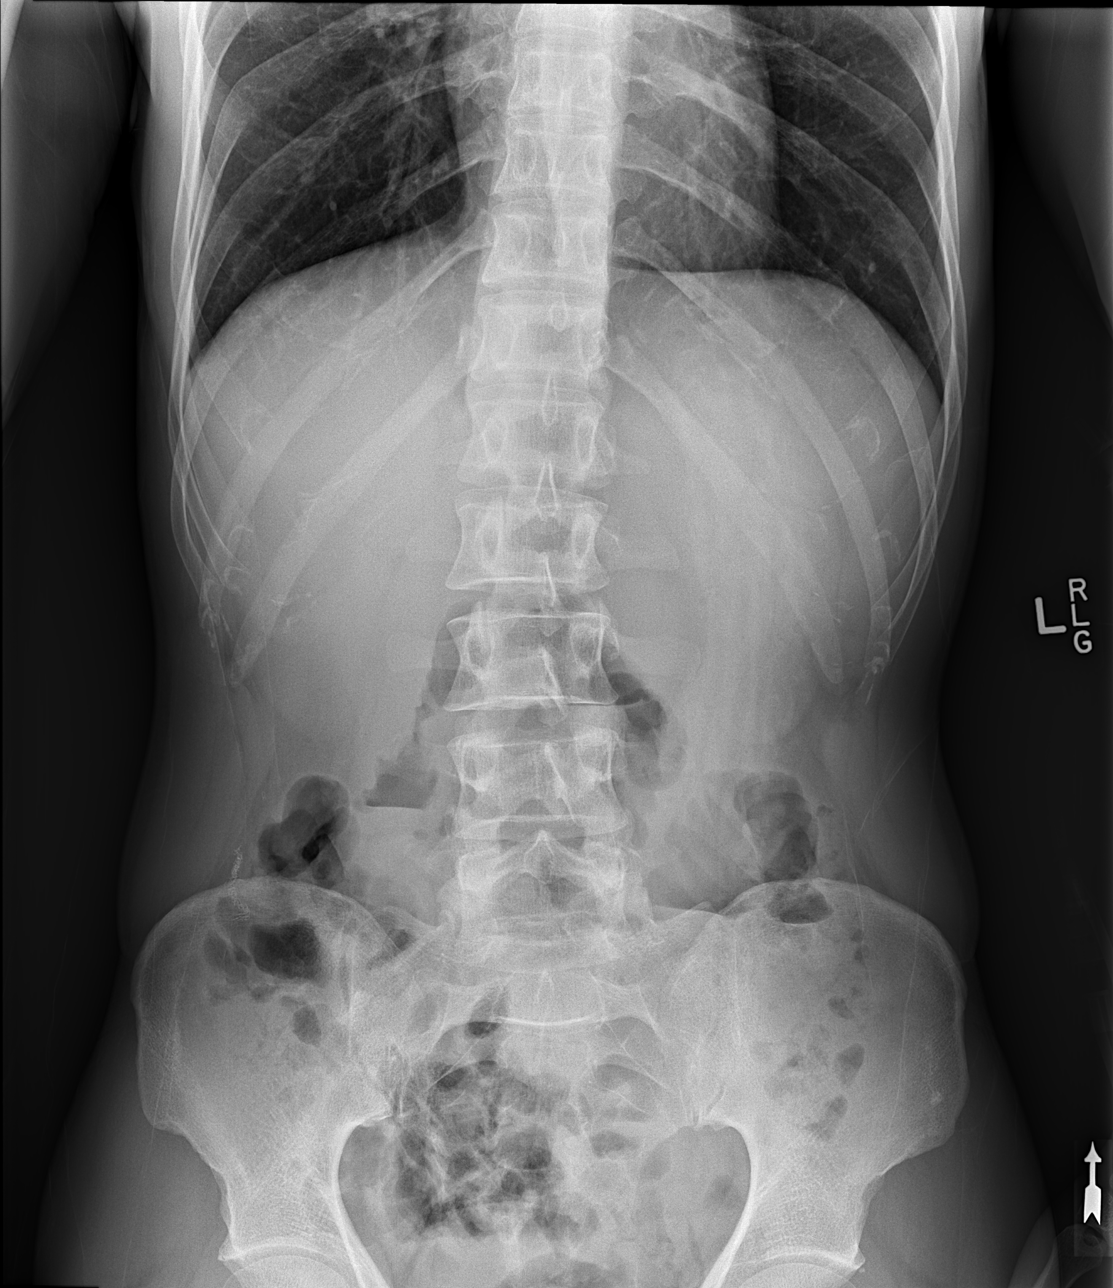

[t abdomen supine]
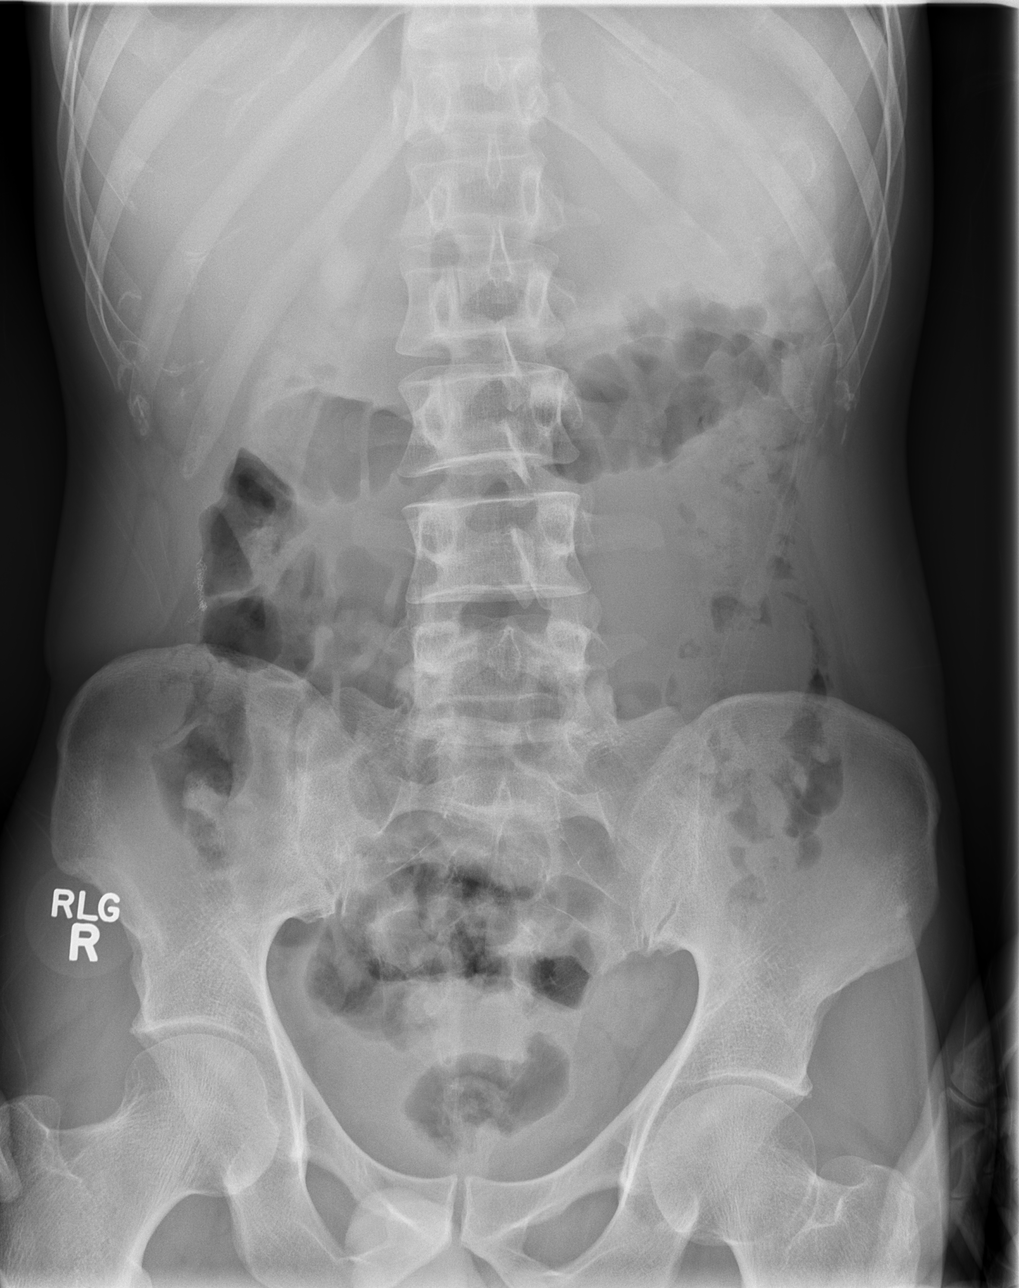

[3 of 3 positions shown; findings below may reference images not displayed]

FINDINGS: Heart size is normal. No pleural effusion or edema. No airspace
consolidation identified. Interstitial coarsening is noted in both
lungs. No dilated loops of small or large bowel noted.
IMPRESSION: 1. Nonobstructive bowel gas pattern.
2. No acute cardiopulmonary abnormalities

## 2016-07-29 IMAGING — CT CT ABD-PELV W/ CM
2 of 4 series · 16 of 46 positions shown, 18 images · IV contrast (OMNIPAQUE 300)
Comparison: CT abdomen and pelvis 04/23/2015.

CLINICAL DATA: Periumbilical pain radiating to the right lower
quadrant. Status post appendectomy 2 weeks ago. Nausea and vomiting
for 2 weeks.

EXAM:
CT ABDOMEN AND PELVIS WITH CONTRAST
TECHNIQUE: Multidetector CT imaging of the abdomen and pelvis was performed
using the standard protocol following bolus administration of
intravenous contrast.
CONTRAST:  80 mL OMNIPAQUE IOHEXOL 300 MG/ML  SOLN

[Series 2: abd/pel with · axial · 0.61mm/px · z∈[-610,-230]mm · 13 of 84 slices shown, 15 images]
[im 4/84  soft-tissue]
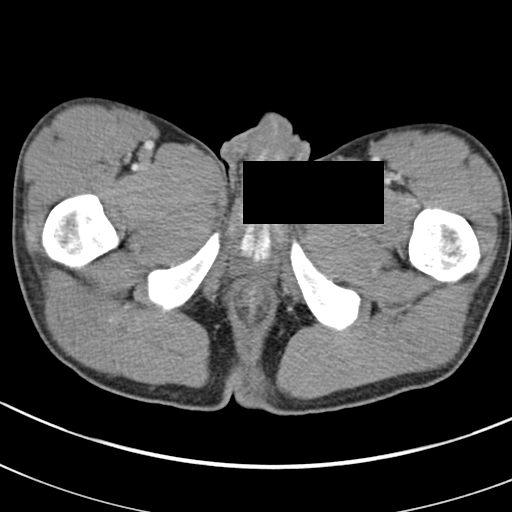
[im 4/84  bone]
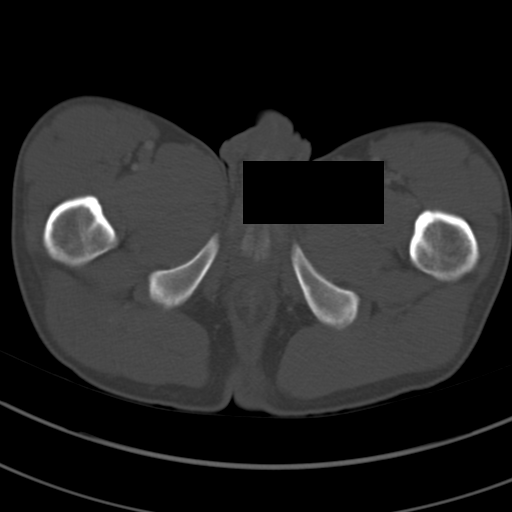
[im 10/84  soft-tissue]
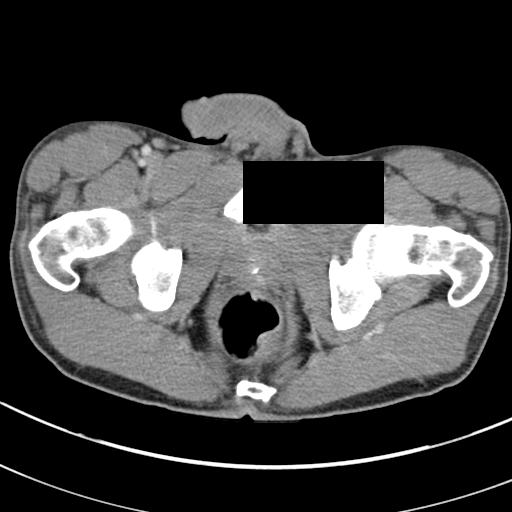
[im 16/84  soft-tissue]
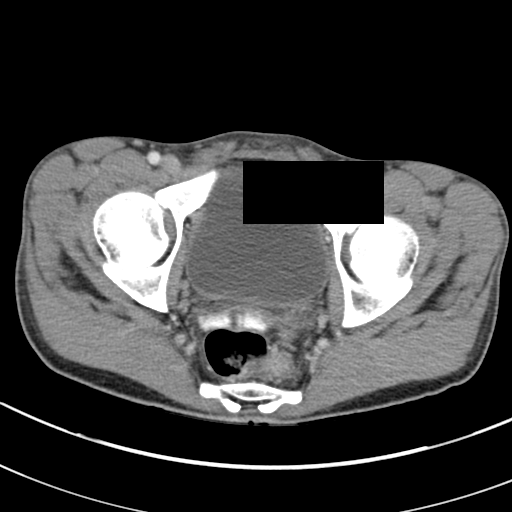
[im 23/84  soft-tissue]
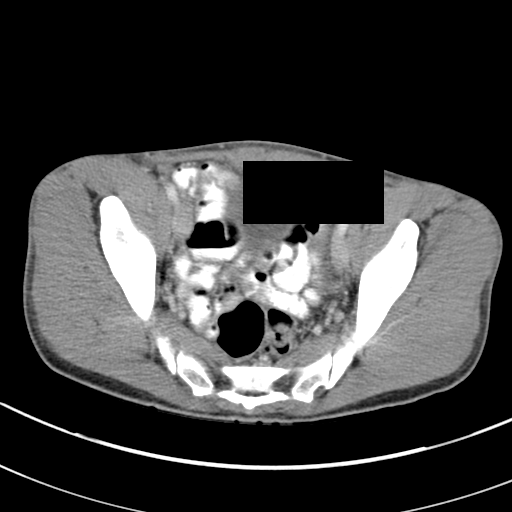
[im 29/84  soft-tissue]
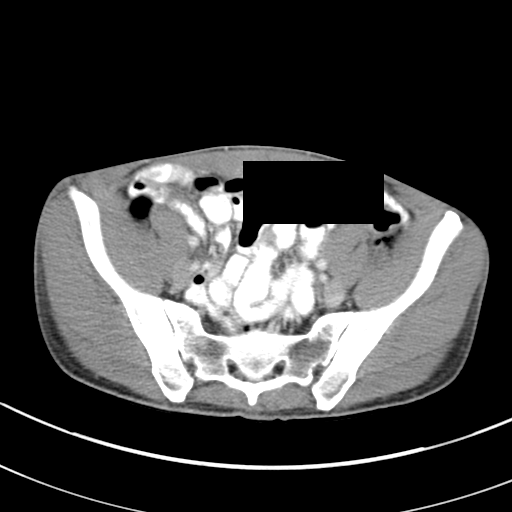
[im 36/84  soft-tissue]
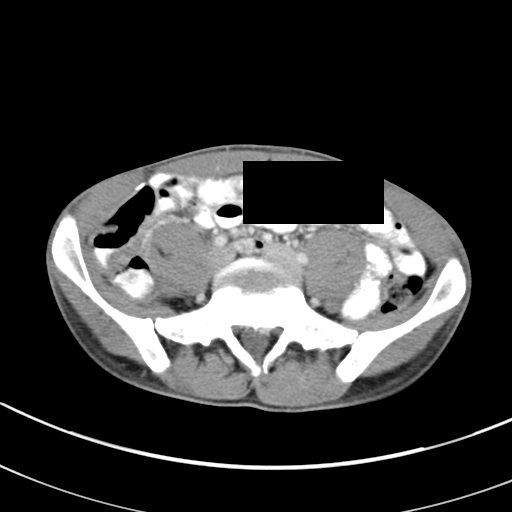
[im 42/84  soft-tissue]
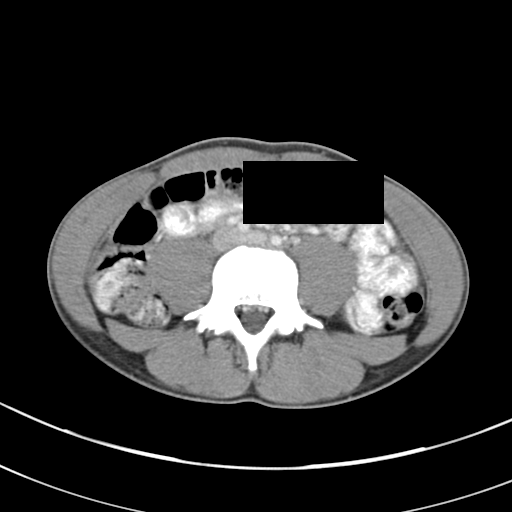
[im 48/84  soft-tissue]
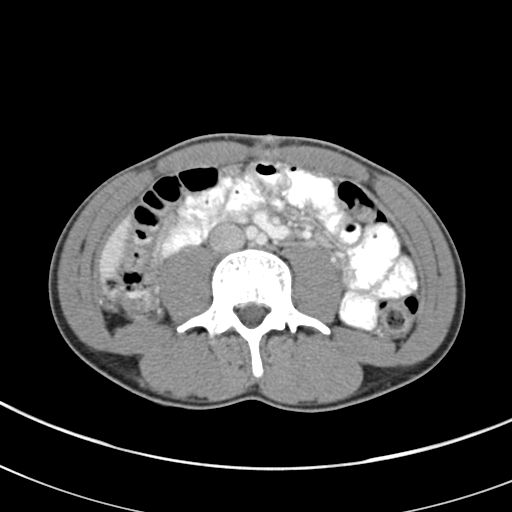
[im 55/84  soft-tissue]
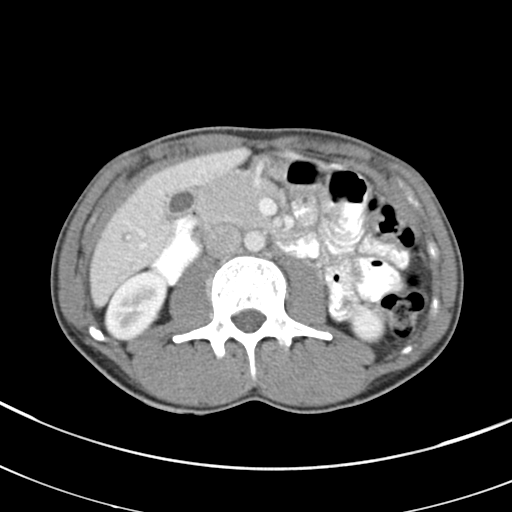
[im 55/84  bone]
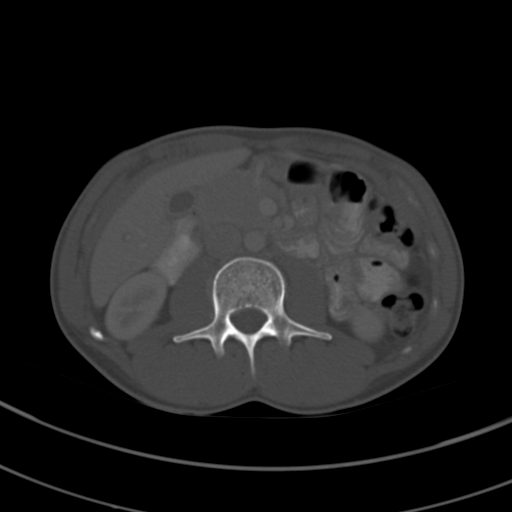
[im 61/84  soft-tissue]
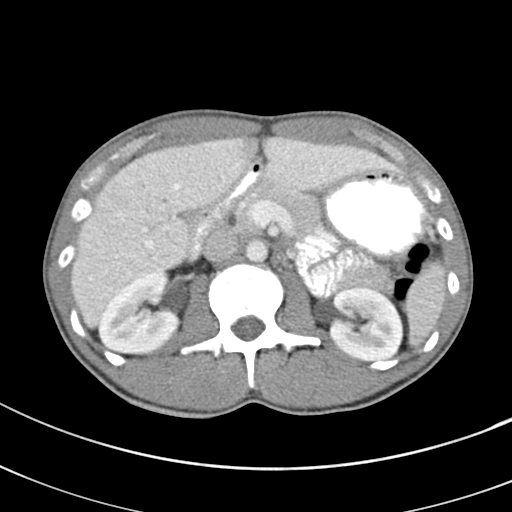
[im 68/84  soft-tissue]
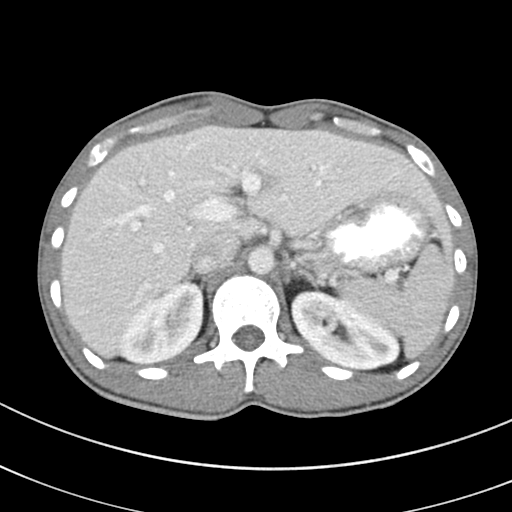
[im 74/84  soft-tissue]
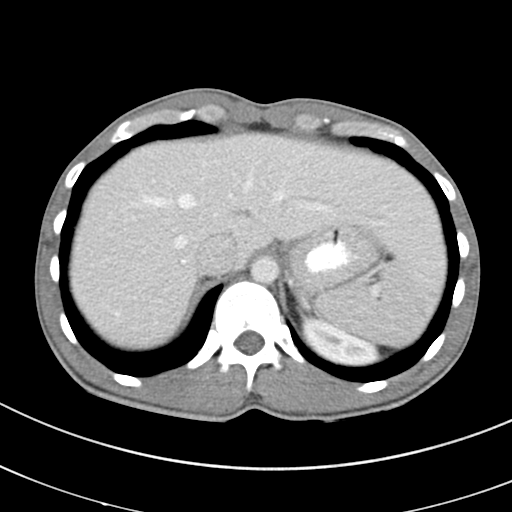
[im 80/84  soft-tissue]
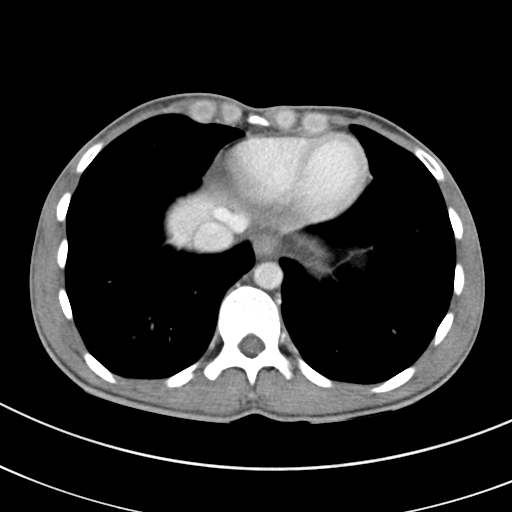

[Series 5: coronal a/|p · coronal · 0.62mm/px · 3 of 65 slices shown]
[im 22/65  soft-tissue]
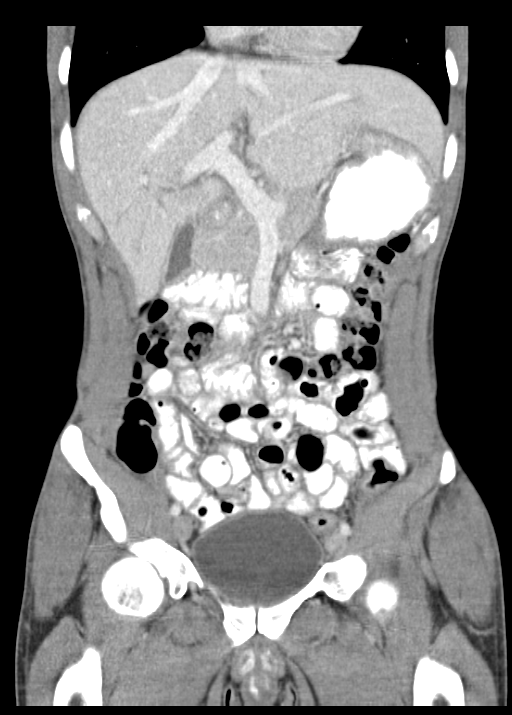
[im 29/65  soft-tissue]
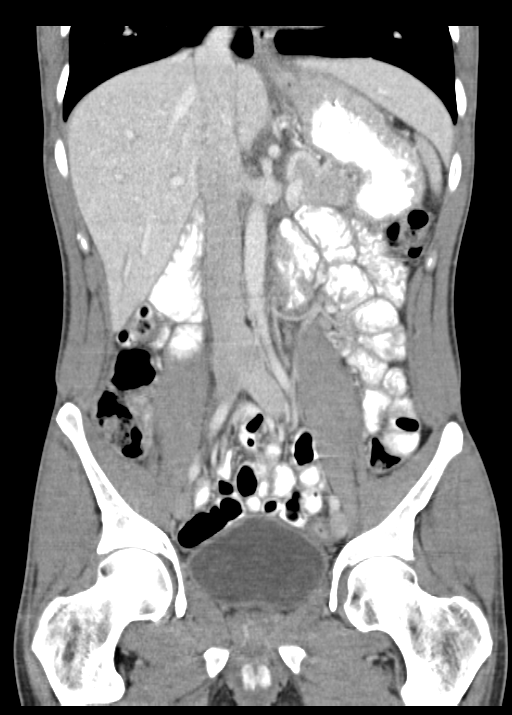
[im 36/65  soft-tissue]
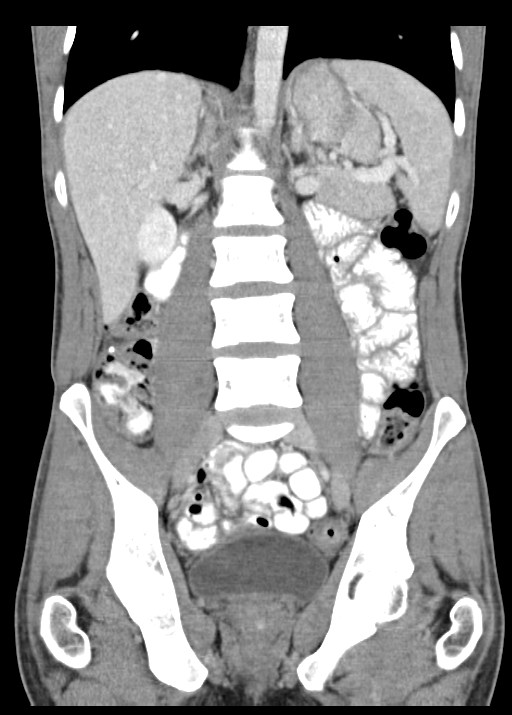

[16 of 46 positions shown; findings below may reference images not displayed]

FINDINGS: The lung bases are clear.  No pleural or pericardial effusion.

The gallbladder, liver, spleen, kidneys, adrenal glands and pancreas
all appear normal.

The stomach and small and large bowel appear normal. The appendix
has been removed. No lymphadenopathy or fluid.

No bony abnormality is identified.
IMPRESSION: Negative CT abdomen and pelvis. No finding to explain the patient's
symptoms. Status post appendectomy without evidence of complication.

## 2017-07-29 ENCOUNTER — Encounter: Payer: Self-pay | Admitting: *Deleted

## 2017-07-31 ENCOUNTER — Ambulatory Visit: Payer: No Typology Code available for payment source | Admitting: Physician Assistant

## 2017-10-07 ENCOUNTER — Encounter (HOSPITAL_COMMUNITY): Payer: Self-pay | Admitting: Emergency Medicine

## 2017-10-07 ENCOUNTER — Other Ambulatory Visit: Payer: Self-pay

## 2017-10-07 ENCOUNTER — Emergency Department (HOSPITAL_COMMUNITY)
Admission: EM | Admit: 2017-10-07 | Discharge: 2017-10-07 | Disposition: A | Discharge status: Home / Self Care | Payer: 59 | Attending: Emergency Medicine | Admitting: Emergency Medicine

## 2017-10-07 DIAGNOSIS — Y9389 Activity, other specified: Secondary | ICD-10-CM | POA: Insufficient documentation

## 2017-10-07 DIAGNOSIS — J45909 Unspecified asthma, uncomplicated: Secondary | ICD-10-CM | POA: Diagnosis not present

## 2017-10-07 DIAGNOSIS — S51851A Open bite of right forearm, initial encounter: Secondary | ICD-10-CM | POA: Insufficient documentation

## 2017-10-07 DIAGNOSIS — T63001A Toxic effect of unspecified snake venom, accidental (unintentional), initial encounter: Secondary | ICD-10-CM | POA: Diagnosis not present

## 2017-10-07 DIAGNOSIS — X58XXXA Exposure to other specified factors, initial encounter: Secondary | ICD-10-CM | POA: Insufficient documentation

## 2017-10-07 DIAGNOSIS — Y999 Unspecified external cause status: Secondary | ICD-10-CM | POA: Diagnosis not present

## 2017-10-07 DIAGNOSIS — Z79899 Other long term (current) drug therapy: Secondary | ICD-10-CM | POA: Insufficient documentation

## 2017-10-07 DIAGNOSIS — W5911XA Bitten by nonvenomous snake, initial encounter: Secondary | ICD-10-CM

## 2017-10-07 DIAGNOSIS — Y9289 Other specified places as the place of occurrence of the external cause: Secondary | ICD-10-CM | POA: Insufficient documentation

## 2017-10-07 LAB — CBC WITH DIFFERENTIAL/PLATELET
Basophils Absolute: 0 10*3/uL (ref 0.0–0.1)
Basophils Relative: 0 %
EOS PCT: 3 %
Eosinophils Absolute: 0.2 10*3/uL (ref 0.0–0.7)
HCT: 38.3 % — ABNORMAL LOW (ref 39.0–52.0)
Hemoglobin: 13.4 g/dL (ref 13.0–17.0)
LYMPHS ABS: 2.8 10*3/uL (ref 0.7–4.0)
LYMPHS PCT: 35 %
MCH: 33.8 pg (ref 26.0–34.0)
MCHC: 35 g/dL (ref 30.0–36.0)
MCV: 96.7 fL (ref 78.0–100.0)
MONO ABS: 0.5 10*3/uL (ref 0.1–1.0)
MONOS PCT: 6 %
Neutro Abs: 4.5 10*3/uL (ref 1.7–7.7)
Neutrophils Relative %: 56 %
PLATELETS: 173 10*3/uL (ref 150–400)
RBC: 3.96 MIL/uL — AB (ref 4.22–5.81)
RDW: 12.7 % (ref 11.5–15.5)
WBC: 8 10*3/uL (ref 4.0–10.5)

## 2017-10-07 LAB — COMPREHENSIVE METABOLIC PANEL
ALT: 17 U/L (ref 17–63)
ANION GAP: 10 (ref 5–15)
AST: 17 U/L (ref 15–41)
Albumin: 4.3 g/dL (ref 3.5–5.0)
Alkaline Phosphatase: 65 U/L (ref 38–126)
BUN: 12 mg/dL (ref 6–20)
CALCIUM: 8.7 mg/dL — AB (ref 8.9–10.3)
CHLORIDE: 103 mmol/L (ref 101–111)
CO2: 26 mmol/L (ref 22–32)
CREATININE: 0.89 mg/dL (ref 0.61–1.24)
Glucose, Bld: 97 mg/dL (ref 65–99)
Potassium: 3.6 mmol/L (ref 3.5–5.1)
Sodium: 139 mmol/L (ref 135–145)
Total Bilirubin: 0.3 mg/dL (ref 0.3–1.2)
Total Protein: 7.1 g/dL (ref 6.5–8.1)

## 2017-10-07 LAB — PROTIME-INR
INR: 1.02
PROTHROMBIN TIME: 13.3 s (ref 11.4–15.2)

## 2017-10-07 LAB — FIBRINOGEN: Fibrinogen: 259 mg/dL (ref 210–475)

## 2017-10-07 NOTE — ED Notes (Signed)
Pickering MD aware of pt status and complaint and verbalizes will come to bedside to assess pt.

## 2017-10-07 NOTE — ED Notes (Signed)
Bed: [facility]3 Expected date:  Expected time:  Means of arrival:  Comments: 

## 2017-10-07 NOTE — ED Notes (Signed)
Bed: [facility]4 Expected date:  Expected time:  Means of arrival:  Comments: 

## 2017-10-07 NOTE — ED Provider Notes (Signed)
[facility] DEPT Provider Note   CSN: [id number] Arrival date & time: 10/07/17  1528     History   Chief Complaint Chief Complaint  Patient presents with  . Snake Bite    HPI Paul Arnold is a 37 y.o. male.  HPI Patient presents with 2 right proximal forearm at around noon today.  States the snake is been killed and they have pictures of it.  States he now is tingling down the hand and possibly some bruising of the arm.  The tingling and pain is worse on the little and fourth finger.  No bruising.  No other injury.  He is otherwise healthy. Past Medical History:  Diagnosis Date  . Appendicitis   . Asthma   . Periodontitis     Patient Active Problem List   Diagnosis Date Noted  . Acute appendicitis 04/23/2015    Past Surgical History:  Procedure Laterality Date  . APPENDECTOMY  04/23/2015   laproscopic   . LAPAROSCOPIC APPENDECTOMY N/A 04/23/2015   Procedure: APPENDECTOMY LAPAROSCOPIC;  Surgeon: Judeth Horn, MD;  Location: [facility];  Service: General;  Laterality: N/A;        Home Medications    Prior to Admission medications   Medication Sig Start Date End Date Taking? Authorizing Provider  Aspirin-Salicylamide-Caffeine (BC HEADACHE POWDER PO) Take 1 packet by mouth daily.   Yes [provider]  cetirizine (ZYRTEC) 10 MG tablet Take 10 mg by mouth daily.   Yes [provider]  clonazePAM (KLONOPIN) 0.5 MG tablet Take 0.5 mg by mouth daily as needed. 07/06/17  Yes [provider]  ibuprofen (ADVIL,MOTRIN) 200 MG tablet Take 600-800 mg by mouth every 6 (six) hours as needed for moderate pain.   Yes [provider]  oxymetazoline (AFRIN) 0.05 % nasal spray Place 1 spray into both nostrils daily.   Yes [provider]    Family History No family history on file.  Social History Social History   Tobacco Use  . Smoking status: Current Every Day Smoker    Packs/day: 0.50    Years: 10.00     Pack years: 5.00    Types: Cigarettes  . Smokeless tobacco: Never Used  Substance Use Topics  . Alcohol use: No  . Drug use: Yes    Types: Marijuana     Allergies   Contrast media [iodinated diagnostic agents] and Penicillins   Review of Systems Review of Systems  Constitutional: Positive for appetite change.  HENT: Negative for congestion.   Respiratory: Negative for shortness of breath.   Cardiovascular: Negative for chest pain.  Gastrointestinal: Negative for abdominal pain.  Genitourinary: Negative for frequency.  Musculoskeletal: Negative for back pain.  Skin: Positive for wound.  Neurological: Positive for numbness.  Hematological: Does not bruise/bleed easily.  Psychiatric/Behavioral: Negative for confusion.     Physical Exam Updated Vital Signs BP (!) 109/97 (BP Location: Left Arm)   Pulse 71   Temp 98.6 F (37 C) (Oral)   Resp 14   Ht 5' 9"  (1.753 m)   Wt 64 kg (141 lb)   SpO2 100%   BMI 20.82 kg/m   Physical Exam  Constitutional: He appears well-developed.  HENT:  Head: Normocephalic.  Eyes: Pupils are equal, round, and reactive to light.  Neck: Neck supple.  Cardiovascular: Normal rate.  Abdominal: Soft.  Musculoskeletal:  2 puncture wounds approximately 2 cm apart on right proximal medial forearm.  Some surrounding erythema.  No other forearm swelling.  Paresthesias on right fourth and fifth finger.  Some mild pain moving those fingers.  Some petechiae versus abrasion right upper lateral arm.  Strong radial pulse.  Neurological: He is alert.  Skin: Skin is warm. Capillary refill takes less than 2 seconds.     ED Treatments / Results  Labs (all labs ordered are listed, but only abnormal results are displayed) Labs Reviewed  CBC WITH DIFFERENTIAL/PLATELET - Abnormal; Notable for the following components:      Result Value   RBC 3.96 (*)    HCT 38.3 (*)    All other components within normal limits  COMPREHENSIVE METABOLIC PANEL -  Abnormal; Notable for the following components:   Calcium 8.7 (*)    All other components within normal limits  PROTIME-INR  FIBRINOGEN    EKG None  Radiology No results found.  Procedures Procedures (including critical care time)  Medications Ordered in ED Medications - No data to display   Initial Impression / Assessment and Plan / ED Course  I have reviewed the triage vital signs and the nursing notes.  Pertinent labs & imaging results that were available during my care of the patient were reviewed by me and considered in my medical decision making (see chart for details).     Patient with snakebite.  Likely dry bite or nonsevere.  No systemic findings.  Followed poison control protocol.  6-hour INR and fibrinogen reassuring.  No spread of the redness or increasing swelling.  Discharge home.  Final Clinical Impressions(s) / ED Diagnoses   Final diagnoses:  Snake bite, initial encounter    ED Discharge Orders    None       Davonna Belling, MD 10/07/17 [phone]

## 2017-10-07 NOTE — ED Triage Notes (Signed)
Pt complaint of right arm pain and twitching to right hand post snake bite of unknown kind 3 hours ago.

## 2017-10-07 NOTE — ED Notes (Signed)
Forearm circumference 9 7/8 in

## 2017-10-16 ENCOUNTER — Ambulatory Visit: Payer: No Typology Code available for payment source | Admitting: Physician Assistant

## 2017-11-17 ENCOUNTER — Ambulatory Visit: Payer: No Typology Code available for payment source | Admitting: Sports Medicine

## 2018-01-06 ENCOUNTER — Encounter: Payer: Self-pay | Admitting: Physician Assistant

## 2018-01-06 ENCOUNTER — Other Ambulatory Visit: Payer: Self-pay

## 2018-01-06 ENCOUNTER — Ambulatory Visit: Payer: 59 | Admitting: Physician Assistant

## 2018-01-06 VITALS — BP 120/84 | HR 94 | Temp 98.7°F | Resp 16 | Ht 66.5 in | Wt 128.0 lb

## 2018-01-06 DIAGNOSIS — B9689 Other specified bacterial agents as the cause of diseases classified elsewhere: Secondary | ICD-10-CM

## 2018-01-06 DIAGNOSIS — J302 Other seasonal allergic rhinitis: Secondary | ICD-10-CM

## 2018-01-06 DIAGNOSIS — Z91018 Allergy to other foods: Secondary | ICD-10-CM | POA: Diagnosis not present

## 2018-01-06 DIAGNOSIS — F411 Generalized anxiety disorder: Secondary | ICD-10-CM | POA: Diagnosis not present

## 2018-01-06 DIAGNOSIS — J019 Acute sinusitis, unspecified: Secondary | ICD-10-CM

## 2018-01-06 MED ORDER — FLUTICASONE PROPIONATE 50 MCG/ACT NA SUSP: 2.0000 | Freq: Every day | NASAL | 6 refills | Status: DC

## 2018-01-06 MED ORDER — DOXYCYCLINE HYCLATE 100 MG PO CAPS: 100.0000 mg | ORAL_CAPSULE | Freq: Two times a day (BID) | ORAL | 0 refills | Status: DC

## 2018-01-06 NOTE — Patient Instructions (Signed)
Please go to the lab today for blood work.  I will call you with your results. We will alter treatment regimen(s) if indicated by your results.    Please take antibiotic as directed.  Increase fluid intake.  Use Saline nasal spray.  Take a daily multivitamin. Stop the Afrin and start the Flonase.  Place a humidifier in the bedroom.  Please call or return clinic if symptoms are not improving.  Sinusitis Sinusitis is redness, soreness, and swelling (inflammation) of the paranasal sinuses. Paranasal sinuses are air pockets within the bones of your face (beneath the eyes, the middle of the forehead, or above the eyes). In healthy paranasal sinuses, mucus is able to drain out, and air is able to circulate through them by way of your nose. However, when your paranasal sinuses are inflamed, mucus and air can become trapped. This can allow bacteria and other germs to grow and cause infection. Sinusitis can develop quickly and last only a short time (acute) or continue over a long period (chronic). Sinusitis that lasts for more than 12 weeks is considered chronic.  CAUSES  Causes of sinusitis include:  Allergies.  Structural abnormalities, such as displacement of the cartilage that separates your nostrils (deviated septum), which can decrease the air flow through your nose and sinuses and affect sinus drainage.  Functional abnormalities, such as when the small hairs (cilia) that line your sinuses and help remove mucus do not work properly or are not present. SYMPTOMS  Symptoms of acute and chronic sinusitis are the same. The primary symptoms are pain and pressure around the affected sinuses. Other symptoms include:  Upper toothache.  Earache.  Headache.  Bad breath.  Decreased sense of smell and taste.  A cough, which worsens when you are lying flat.  Fatigue.  Fever.  Thick drainage from your nose, which often is green and may contain pus (purulent).  Swelling and warmth over the  affected sinuses. DIAGNOSIS  Your caregiver will perform a physical exam. During the exam, your caregiver may:  Look in your nose for signs of abnormal growths in your nostrils (nasal polyps).  Tap over the affected sinus to check for signs of infection.  View the inside of your sinuses (endoscopy) with a special imaging device with a light attached (endoscope), which is inserted into your sinuses. If your caregiver suspects that you have chronic sinusitis, one or more of the following tests may be recommended:  Allergy tests.  Nasal culture A sample of mucus is taken from your nose and sent to a lab and screened for bacteria.  Nasal cytology A sample of mucus is taken from your nose and examined by your caregiver to determine if your sinusitis is related to an allergy. TREATMENT  Most cases of acute sinusitis are related to a viral infection and will resolve on their own within 10 days. Sometimes medicines are prescribed to help relieve symptoms (pain medicine, decongestants, nasal steroid sprays, or saline sprays).  However, for sinusitis related to a bacterial infection, your caregiver will prescribe antibiotic medicines. These are medicines that will help kill the bacteria causing the infection.  Rarely, sinusitis is caused by a fungal infection. In theses cases, your caregiver will prescribe antifungal medicine. For some cases of chronic sinusitis, surgery is needed. Generally, these are cases in which sinusitis recurs more than 3 times per year, despite other treatments. HOME CARE INSTRUCTIONS   Drink plenty of water. Water helps thin the mucus so your sinuses can drain more easily.  Use a humidifier.  Inhale steam 3 to 4 times a day (for example, sit in the bathroom with the shower running).  Apply a warm, moist washcloth to your face 3 to 4 times a day, or as directed by your caregiver.  Use saline nasal sprays to help moisten and clean your sinuses.  Take over-the-counter or  prescription medicines for pain, discomfort, or fever only as directed by your caregiver. SEEK IMMEDIATE MEDICAL CARE IF:  You have increasing pain or severe headaches.  You have nausea, vomiting, or drowsiness.  You have swelling around your face.  You have vision problems.  You have a stiff neck.  You have difficulty breathing. MAKE SURE YOU:   Understand these instructions.  Will watch your condition.  Will get help right away if you are not doing well or get worse. Document Released: 05/05/2005 Document Revised: 07/28/2011 Document Reviewed: 05/20/2011 [facility] Patient Information 2014 [place], [place].

## 2018-01-06 NOTE — Progress Notes (Signed)
Patient presents to clinic today to establish care.  Acute Concerns: Rash after eating red meat associated with pruritus. Denies throat swelling or SOB. Notes a tick bite 1 month ago and concerned for tick induced meat allergies. Denies known history of tick-borne illness. Denies fever, chills, malaise or fatigue.  Patient also with 4 weeks of sinus pressure, nasal congestion and R frontal sinus pain with headache. Denies fever or chills. Has history of environmental allergies, not currently taking anything for this. Denies chest congestion, chest pain or SOB. Denies recent travel or sick contact. Has been using Afrin on occasion to help with pressure and nasal congestion.  Chronic Issues: Anxiety -- + history of generalized anxiety with rare panic attack. Klonopin PRN. Rare use. Denies depressed mood or SI/HI.  Feels that he is doing very well overall.  Past Medical History:  Diagnosis Date  . Appendicitis   . Asthma   . Periodontitis     Past Surgical History:  Procedure Laterality Date  . APPENDECTOMY  04/23/2015   laproscopic   . LAPAROSCOPIC APPENDECTOMY N/A 04/23/2015   Procedure: APPENDECTOMY LAPAROSCOPIC;  Surgeon: Judeth Horn, MD;  Location: [facility];  Service: General;  Laterality: N/A;    Current Outpatient Medications on File Prior to Visit  Medication Sig Dispense Refill  . clonazePAM (KLONOPIN) 0.5 MG tablet Take 0.5 mg by mouth daily as needed.  1   No current facility-administered medications on file prior to visit.     Allergies  Allergen Reactions  . Contrast Media [Iodinated Diagnostic Agents] Hives and Itching    Itching , hives on 04-23-15 CT exam 05-10-15---pt got 1 hr emergent prep in ED for CT -dosed with Barium--no reaction noted  . Penicillins     Childhood allergy Has patient had a PCN reaction causing immediate rash, facial/tongue/throat swelling, SOB or lightheadedness with hypotension: unknown Has patient had a PCN reaction causing severe rash  involving mucus membranes or skin necrosis: unknown Has patient had a PCN reaction that required hospitalization unknown Has patient had a PCN reaction occurring within the last 10 years: unknown If all of the above answers are "NO", then may proceed with Cephalosporin use.     History reviewed. No pertinent family history.  Social History   Socioeconomic History  . Marital status: Married    Spouse name: Not on file  . Number of children: 2  . Years of education: Not on file  . Highest education level: Not on file  Occupational History  . Occupation: Agricultural consultant  Social Needs  . Financial resource strain: Not on file  . Food insecurity:    Worry: Not on file    Inability: Not on file  . Transportation needs:    Medical: Not on file    Non-medical: Not on file  Tobacco Use  . Smoking status: Current Every Day Smoker    Packs/day: 0.50    Years: 10.00    Pack years: 5.00    Types: Cigarettes  . Smokeless tobacco: Never Used  Substance and Sexual Activity  . Alcohol use: No  . Drug use: Yes    Frequency: 3.0 times per week    Types: Marijuana  . Sexual activity: Yes  Lifestyle  . Physical activity:    Days per week: Not on file    Minutes per session: Not on file  . Stress: Not on file  Relationships  . Social connections:    Talks on phone: Not on file    Gets  together: Not on file    Attends religious service: Not on file    Active member of club or organization: Not on file    Attends meetings of clubs or organizations: Not on file    Relationship status: Not on file  . Intimate partner violence:    Fear of current or ex partner: Not on file    Emotionally abused: Not on file    Physically abused: Not on file    Forced sexual activity: Not on file  Other Topics Concern  . Not on file  Social History Narrative  . Not on file   Review of Systems  Constitutional: Negative for fever and weight loss.  HENT: Negative for ear discharge, ear pain, hearing  loss and tinnitus.   Eyes: Negative for blurred vision, double vision, photophobia and pain.  Respiratory: Negative for cough and shortness of breath.   Cardiovascular: Negative for chest pain and palpitations.  Gastrointestinal: Negative for abdominal pain, blood in stool, constipation, diarrhea, heartburn, melena, nausea and vomiting.  Genitourinary: Negative for dysuria, flank pain, frequency, hematuria and urgency.  Musculoskeletal: Negative for falls.  Neurological: Negative for dizziness, loss of consciousness and headaches.  Endo/Heme/Allergies: Negative for environmental allergies.  Psychiatric/Behavioral: Negative for depression, hallucinations, substance abuse and suicidal ideas. The patient is not nervous/anxious and does not have insomnia.    BP 120/84   Pulse 94   Temp 98.7 F (37.1 C) (Oral)   Resp 16   Ht 5' 6.5" (1.689 m)   Wt 128 lb (58.1 kg)   SpO2 98%   BMI 20.35 kg/m   Physical Exam  Constitutional: He is oriented to person, place, and time. He appears well-developed and well-nourished.  HENT:  Head: Normocephalic and atraumatic.  Right Ear: External ear normal.  Left Ear: External ear normal.  Mouth/Throat: Oropharynx is clear and moist. No oropharyngeal exudate.  Eyes: Pupils are equal, round, and reactive to light. Conjunctivae are normal.  Neck: Neck supple.  Cardiovascular: Normal rate, regular rhythm, normal heart sounds and intact distal pulses.  Pulmonary/Chest: Effort normal and breath sounds normal. No stridor. No respiratory distress. He has no wheezes. He has no rales. He exhibits no tenderness.  Lymphadenopathy:    He has no cervical adenopathy.  Neurological: He is alert and oriented to person, place, and time.  Skin: No rash noted.  Psychiatric: He has a normal mood and affect.  Vitals reviewed.  Assessment/Plan: 1. GAD (generalized anxiety disorder) With occasional acute anxiety. Handles very well for the most part on his own following  stress relief tactics and breathing. Rare use of Klonopin for severe anxiety. Continue same. Will monitor.   2. Acute bacterial sinusitis Rx Doxycycline.  Increase fluids.  Rest.  Saline nasal spray.  Probiotic.  Mucinex as directed.  Humidifier in bedroom. Flonase.  Call or return to clinic if symptoms are not improving.  - doxycycline (VIBRAMYCIN) 100 MG capsule; Take 1 capsule (100 mg total) by mouth 2 (two) times daily.  Dispense: 20 capsule; Refill: 0 - fluticasone (FLONASE) 50 MCG/ACT nasal spray; Place 2 sprays into both nostrils daily.  Dispense: 16 g; Refill: 6  3. Seasonal allergic rhinitis, unspecified trigger Start Flonase and daily OTC antihistamine  4. Allergy to meat Tick bite 1 month ago. Since then with itch and mild rash of torso with eating red meats. Will check alpha-gal panel today. If negative will refer to allergist.  - Alpha-Gal Panel   [name], PA-C

## 2018-01-08 ENCOUNTER — Encounter: Payer: Self-pay | Admitting: Physician Assistant

## 2018-01-11 ENCOUNTER — Encounter: Payer: Self-pay | Admitting: Physician Assistant

## 2018-01-11 NOTE — Telephone Encounter (Signed)
Please call lab to check on status of alpha gal panel. Thank you.

## 2018-01-12 ENCOUNTER — Encounter: Payer: Self-pay | Admitting: Physician Assistant

## 2018-01-12 DIAGNOSIS — Z91018 Allergy to other foods: Secondary | ICD-10-CM

## 2018-01-12 LAB — ALPHA-GAL PANEL
Beef IgE: 0.16 kU/L (ref <0.35)
CLASS: 0
Galactose-alpha-1,3-galactose IgE: 0.32 kU/L — ABNORMAL HIGH (ref <0.10)
LAMB/MUTTON IGE: <0.1 kU/L (ref <0.35)
PORK IGE: 0.13 kU/L (ref <0.35)

## 2018-02-08 ENCOUNTER — Encounter: Payer: Self-pay | Admitting: Physician Assistant

## 2018-02-08 ENCOUNTER — Ambulatory Visit: Payer: 59 | Admitting: Physician Assistant

## 2018-02-08 ENCOUNTER — Other Ambulatory Visit: Payer: Self-pay

## 2018-02-08 VITALS — BP 92/60 | HR 82 | Temp 98.1°F | Resp 14 | Ht 67.0 in | Wt 126.0 lb

## 2018-02-08 DIAGNOSIS — B9689 Other specified bacterial agents as the cause of diseases classified elsewhere: Secondary | ICD-10-CM | POA: Diagnosis not present

## 2018-02-08 DIAGNOSIS — J208 Acute bronchitis due to other specified organisms: Secondary | ICD-10-CM

## 2018-02-08 MED ORDER — IPRATROPIUM-ALBUTEROL 0.5-2.5 (3) MG/3ML IN SOLN
3.0000 mL | Freq: Once | RESPIRATORY_TRACT | Status: AC
  Administered 2018-02-08: 3 mL via RESPIRATORY_TRACT

## 2018-02-08 MED ORDER — PROMETHAZINE-DM 6.25-15 MG/5ML PO SYRP: 5.0000 mL | ORAL_SOLUTION | Freq: Four times a day (QID) | ORAL | 0 refills | Status: DC | PRN

## 2018-02-08 MED ORDER — BECLOMETHASONE DIPROP HFA 80 MCG/ACT IN AERB: 1.0000 | INHALATION_SPRAY | Freq: Two times a day (BID) | RESPIRATORY_TRACT | 0 refills | Status: DC

## 2018-02-08 MED ORDER — AZITHROMYCIN 250 MG PO TABS: ORAL_TABLET | ORAL | 0 refills | Status: DC

## 2018-02-08 NOTE — Patient Instructions (Signed)
Take antibiotic (Azithromycin) as directed.  Increase fluids.  Get plenty of rest. Use Mucinex for congestion. Use the prescription cough medication as directed. Use the Qvar as directed for chest tightness/wheezing. Take a daily probiotic (I recommend Align or Culturelle, but even Activia Yogurt may be beneficial).  A humidifier placed in the bedroom may offer some relief for a dry, scratchy throat of nasal irritation.  Read information below on acute bronchitis. Please call or return to clinic if symptoms are not improving.  Schedule an appointment with me when you are feeling better to discuss options for smoking cessation.   Acute Bronchitis Bronchitis is when the airways that extend from the windpipe into the lungs get red, puffy, and painful (inflamed). Bronchitis often causes thick spit (mucus) to develop. This leads to a cough. A cough is the most common symptom of bronchitis. In acute bronchitis, the condition usually begins suddenly and goes away over time (usually in 2 weeks). Smoking, allergies, and asthma can make bronchitis worse. Repeated episodes of bronchitis may cause more lung problems.  HOME CARE  Rest.  Drink enough fluids to keep your pee (urine) clear or pale yellow (unless you need to limit fluids as told by your doctor).  Only take over-the-counter or prescription medicines as told by your doctor.  Avoid smoking and secondhand smoke. These can make bronchitis worse. If you are a smoker, think about using nicotine gum or skin patches. Quitting smoking will help your lungs heal faster.  Reduce the chance of getting bronchitis again by:  Washing your hands often.  Avoiding people with cold symptoms.  Trying not to touch your hands to your mouth, nose, or eyes.  Follow up with your doctor as told.  GET HELP IF: Your symptoms do not improve after 1 week of treatment. Symptoms include:  Cough.  Fever.  Coughing up thick spit.  Body aches.  Chest  congestion.  Chills.  Shortness of breath.  Sore throat.  GET HELP RIGHT AWAY IF:   You have an increased fever.  You have chills.  You have severe shortness of breath.  You have bloody thick spit (sputum).  You throw up (vomit) often.  You lose too much body fluid (dehydration).  You have a severe headache.  You faint.  MAKE SURE YOU:   Understand these instructions.  Will watch your condition.  Will get help right away if you are not doing well or get worse. Document Released: 10/22/2007 Document Revised: 01/05/2013 Document Reviewed: 10/26/2012 [facility] Patient Information 2015 [place], [place]. This information is not intended to replace advice given to you by your health care provider. Make sure you discuss any questions you have with your health care provider.

## 2018-02-08 NOTE — Progress Notes (Signed)
Patient presents to clinic today c/o 2 weeks of chest congestion and significant cough that is now productive of yellow-brown sputum. Notes having some intermittent fever with Tmax of 101.0, chest tightness and wheezing. Notes having to sleep sitting up at night. Notes some R ear pain. Denies recent travel. Denies sick contact. Has taken some OTC cough medication without much improvement.   Past Medical History:  Diagnosis Date  . Appendicitis   . Asthma   . Periodontitis     Current Outpatient Medications on File Prior to Visit  Medication Sig Dispense Refill  . clonazePAM (KLONOPIN) 0.5 MG tablet Take 0.5 mg by mouth daily as needed.  1  . fluticasone (FLONASE) 50 MCG/ACT nasal spray Place 2 sprays into both nostrils daily. 16 g 6   No current facility-administered medications on file prior to visit.     Allergies  Allergen Reactions  . Contrast Media [Iodinated Diagnostic Agents] Hives and Itching    Itching , hives on 04-23-15 CT exam 05-10-15---pt got 1 hr emergent prep in ED for CT -dosed with Barium--no reaction noted  . Penicillins     Childhood allergy Has patient had a PCN reaction causing immediate rash, facial/tongue/throat swelling, SOB or lightheadedness with hypotension: unknown Has patient had a PCN reaction causing severe rash involving mucus membranes or skin necrosis: unknown Has patient had a PCN reaction that required hospitalization unknown Has patient had a PCN reaction occurring within the last 10 years: unknown If all of the above answers are "NO", then may proceed with Cephalosporin use.     History reviewed. No pertinent family history.  Social History   Socioeconomic History  . Marital status: Married    Spouse name: Not on file  . Number of children: 2  . Years of education: Not on file  . Highest education level: Not on file  Occupational History  . Occupation: Agricultural consultant  Social Needs  . Financial resource strain: Not on file  . Food  insecurity:    Worry: Not on file    Inability: Not on file  . Transportation needs:    Medical: Not on file    Non-medical: Not on file  Tobacco Use  . Smoking status: Current Every Day Smoker    Packs/day: 0.50    Years: 10.00    Pack years: 5.00    Types: Cigarettes  . Smokeless tobacco: Never Used  Substance and Sexual Activity  . Alcohol use: No  . Drug use: Yes    Frequency: 3.0 times per week    Types: Marijuana  . Sexual activity: Yes  Lifestyle  . Physical activity:    Days per week: Not on file    Minutes per session: Not on file  . Stress: Not on file  Relationships  . Social connections:    Talks on phone: Not on file    Gets together: Not on file    Attends religious service: Not on file    Active member of club or organization: Not on file    Attends meetings of clubs or organizations: Not on file    Relationship status: Not on file  Other Topics Concern  . Not on file  Social History Narrative  . Not on file   Review of Systems - See HPI.  All other ROS are negative.  BP 92/60   Pulse 82   Temp 98.1 F (36.7 C) (Oral)   Resp 14   Ht 5' 7"  (1.702 m)  Wt 126 lb (57.2 kg)   SpO2 98%   BMI 19.73 kg/m   Physical Exam  Constitutional: He is oriented to person, place, and time. He appears well-developed and well-nourished.  HENT:  Head: Normocephalic and atraumatic.  Eyes: Conjunctivae are normal.  Neck: Neck supple.  Cardiovascular: Normal rate, regular rhythm, normal heart sounds and intact distal pulses.  Pulmonary/Chest: Effort normal. No stridor. No respiratory distress. He has wheezes. He has no rales. He exhibits no tenderness.  Neurological: He is alert and oriented to person, place, and time.  Psychiatric: He has a normal mood and affect.  Vitals reviewed.  Recent Results (from the past 2160 hour(s))  Alpha-Gal Panel     Status: Abnormal   Collection Time: 01/06/18  2:22 PM  Result Value Ref Range   Galactose-alpha-1,3-galactose IgE  0.32 (H) <0.10 kU/L    Comment: Previous reports (JACI [id number]) have demonstrated that patients with IgE antibodies to galactose-a-1,3-galactose are at risk for delayed anaphylaxis, angioedema, or urticaria following consumption of beef, pork, or lamb.    Beef IgE 0.16 <0.35 kU/L   Class 0/1    LAMB/MUTTON IGE <0.10 <0.35 kU/L   Class 0    Pork IgE 0.13 <0.35 kU/L   Class 0/1     Comment: The test method is the [employer] ImmunoCAP allergen-specific IgE system. CLASS INTERPRETATION   <0.10 kU/L= 0, Negative; 0.10 - 0.34 kU/L= 0/1, Equivocal/Borderline;  0.35 - 0.69  kU/L=1, Low Positive; 0.70 - 3.49 kU/L=2, Moderate Positive;  3.50  - 17.49 kU/L=3, High Positive; 17.50 - 49.99 kU/L= 4, Very High Positive; 50.00 - 99.99  kU/L= 5, Very High Positive;   >99.99 kU/L=6, Very High Positive *This test was developed and its performance characteristics determined by [employer]. It has not been cleared or approved by the U.S. Food and Drug Administration.    Assessment/Plan: 1. Acute bacterial bronchitis Rx Azithromycin.  Increase fluids.  Rest.  Saline nasal spray.  Probiotic.  Mucinex as directed.  Humidifier in bedroom. Rx promethazine-DM. Rx Qvar to use as directed until wheezing calms down.  Patient to schedule follow-up once better to discuss smoking cessation.   - azithromycin (ZITHROMAX) 250 MG tablet; Take 2 tablets on Day 1. Then take 1 tablet daily.  Dispense: 6 tablet; Refill: 0 - ipratropium-albuterol (DUONEB) 0.5-2.5 (3) MG/3ML nebulizer solution 3 mL   [name], PA-C

## 2018-02-15 ENCOUNTER — Encounter: Payer: Self-pay | Admitting: Physician Assistant

## 2018-02-15 DIAGNOSIS — F411 Generalized anxiety disorder: Secondary | ICD-10-CM

## 2018-02-15 MED ORDER — CLONAZEPAM 0.5 MG PO TABS: 0.5000 mg | ORAL_TABLET | Freq: Every day | ORAL | 0 refills | Status: DC | PRN

## 2018-02-15 MED ORDER — BECLOMETHASONE DIPROP HFA 80 MCG/ACT IN AERB: 1.0000 | INHALATION_SPRAY | Freq: Two times a day (BID) | RESPIRATORY_TRACT | 0 refills | Status: DC

## 2018-02-16 ENCOUNTER — Ambulatory Visit: Payer: 59 | Admitting: Allergy and Immunology

## 2018-04-01 ENCOUNTER — Ambulatory Visit: Payer: Self-pay

## 2018-04-01 NOTE — Telephone Encounter (Signed)
Pt c/o bilateral ear congestion that began 03/29/18. Pt stated that it feels like there is water in his ears. Pt stated that he uses Qtips twice a day. Pt stated he thinks it is wax in his ears. Pt stated he has a mild earache behind the right ear. Pt denies symptoms of hay fever, sneezing or nasal discharge. Pt stated he has a non productive cough, but stated he is a smoker.  Care advice given to pt and pt verbalized understanding. Appt made for pt tomorrow at 10:30 am with Elyn Aquas PA-C. Reason for Disposition . Ear congestion present > 48 hours  Answer Assessment - Initial Assessment Questions 1. LOCATION: "Which ear is involved?"       Both ears 2. SENSATION: "Describe how the ear feels."      Feels like they have have fluid in his ear 3. ONSET:  "When did the ear symptoms start?"       3 days 4. PAIN: "Do you also have an earache?" If so, ask: "How bad is it?" (Scale 1-10; or mild, moderate, severe)     Mild earache behind right ear 5. CAUSE: "What do you think is causing the ear congestion?"     Ear wax 6. URI: "Do you have a runny nose or cough?"      Mild cough that started this morning non productive 7. NASAL ALLERGIES: "Are there symptoms of hay fever, such as sneezing or a clear nasal discharge?"     no 8. PREGNANCY: "Is there any chance you are pregnant?" "When was your last menstrual period?"     n/a  Protocols used: EAR - CONGESTION-A-AH

## 2018-04-02 ENCOUNTER — Ambulatory Visit: Payer: 59 | Admitting: Physician Assistant

## 2018-04-02 ENCOUNTER — Encounter: Payer: Self-pay | Admitting: Physician Assistant

## 2018-04-02 ENCOUNTER — Other Ambulatory Visit: Payer: Self-pay

## 2018-04-02 VITALS — BP 100/70 | HR 92 | Temp 98.6°F | Resp 14 | Ht 67.0 in | Wt 125.0 lb

## 2018-04-02 DIAGNOSIS — F17209 Nicotine dependence, unspecified, with unspecified nicotine-induced disorders: Secondary | ICD-10-CM

## 2018-04-02 DIAGNOSIS — H6123 Impacted cerumen, bilateral: Secondary | ICD-10-CM | POA: Diagnosis not present

## 2018-04-02 MED ORDER — BUPROPION HCL ER (XL) 150 MG PO TB24: 150.0000 mg | ORAL_TABLET | Freq: Every day | ORAL | 2 refills | Status: DC

## 2018-04-02 NOTE — Progress Notes (Signed)
Acute Office Visit  Subjective:    Patient ID: Paul Arnold, male    DOB: 05-14-1981, 37 y.o.   MRN: [id number]  Chief Complaint  Patient presents with  . Otalgia    Otalgia   There is pain in both ears. This is a new problem. The current episode started in the past 7 days. The problem occurs constantly. The problem has been unchanged. There has been no fever. The pain is at a severity of 2/10. The pain is mild. Associated symptoms include hearing loss. Pertinent negatives include no coughing, diarrhea, ear discharge or headaches. He has tried nothing for the symptoms. Patient notes hx of cerumen impaction   Patient also would like to discuss smoking cessation options today. Is currently smoking 1-1.5 ppd. Has a 30 pack history. Asymptomatic.   Past Medical History:  Diagnosis Date  . Appendicitis   . Asthma   . Periodontitis     Past Surgical History:  Procedure Laterality Date  . APPENDECTOMY  04/23/2015   laproscopic   . LAPAROSCOPIC APPENDECTOMY N/A 04/23/2015   Procedure: APPENDECTOMY LAPAROSCOPIC;  Surgeon: Judeth Horn, MD;  Location: [facility];  Service: General;  Laterality: N/A;    History reviewed. No pertinent family history.  Social History   Socioeconomic History  . Marital status: Married    Spouse name: Not on file  . Number of children: 2  . Years of education: Not on file  . Highest education level: Not on file  Occupational History  . Occupation: Agricultural consultant  Social Needs  . Financial resource strain: Not on file  . Food insecurity:    Worry: Not on file    Inability: Not on file  . Transportation needs:    Medical: Not on file    Non-medical: Not on file  Tobacco Use  . Smoking status: Current Every Day Smoker    Packs/day: 0.50    Years: 10.00    Pack years: 5.00    Types: Cigarettes  . Smokeless tobacco: Never Used  Substance and Sexual Activity  . Alcohol use: No  . Drug use: Yes    Frequency: 3.0 times per week    Types: Marijuana   . Sexual activity: Yes  Lifestyle  . Physical activity:    Days per week: Not on file    Minutes per session: Not on file  . Stress: Not on file  Relationships  . Social connections:    Talks on phone: Not on file    Gets together: Not on file    Attends religious service: Not on file    Active member of club or organization: Not on file    Attends meetings of clubs or organizations: Not on file    Relationship status: Not on file  . Intimate partner violence:    Fear of current or ex partner: Not on file    Emotionally abused: Not on file    Physically abused: Not on file    Forced sexual activity: Not on file  Other Topics Concern  . Not on file  Social History Narrative  . Not on file    Outpatient Medications Prior to Visit  Medication Sig Dispense Refill  . beclomethasone (QVAR) 80 MCG/ACT inhaler Inhale 1 puff into the lungs 2 (two) times daily. 10.6 g 0  . clonazePAM (KLONOPIN) 0.5 MG tablet Take 1 tablet (0.5 mg total) by mouth daily as needed. 20 tablet 0  . fluticasone (FLONASE) 50 MCG/ACT nasal spray Place 2  sprays into both nostrils daily. 16 g 6   No facility-administered medications prior to visit.     Allergies  Allergen Reactions  . Contrast Media [Iodinated Diagnostic Agents] Hives and Itching    Itching , hives on 04-23-15 CT exam 05-10-15---pt got 1 hr emergent prep in ED for CT -dosed with Barium--no reaction noted  . Penicillins     Childhood allergy Has patient had a PCN reaction causing immediate rash, facial/tongue/throat swelling, SOB or lightheadedness with hypotension: unknown Has patient had a PCN reaction causing severe rash involving mucus membranes or skin necrosis: unknown Has patient had a PCN reaction that required hospitalization unknown Has patient had a PCN reaction occurring within the last 10 years: unknown If all of the above answers are "NO", then may proceed with Cephalosporin use.    Review of Systems  HENT: Positive for ear  pain and hearing loss. Negative for ear discharge.   Respiratory: Negative for cough.   Gastrointestinal: Negative for diarrhea.  Neurological: Negative for headaches.   Objective:   Physical Exam  Constitutional: He is oriented to person, place, and time. He appears well-developed and well-nourished.  HENT:  Head: Normocephalic and atraumatic.  Nose: Nose normal.  Cerumen impaction noted bilaterally.  Eyes: Conjunctivae are normal.  Neck: Neck supple.  Cardiovascular: Normal rate, regular rhythm, normal heart sounds and intact distal pulses.  Pulmonary/Chest: Effort normal and breath sounds normal. No respiratory distress. He has no wheezes. He has no rales. He exhibits no tenderness.  Neurological: He is alert and oriented to person, place, and time.  Vitals reviewed.   BP 100/70   Pulse 92   Temp 98.6 F (37 C) (Oral)   Resp 14   Ht _0  (1.702 m)   Wt 125 lb (56.7 kg)   SpO2 98%   BMI 19.58 kg/m  Wt Readings from Last 3 Encounters:  04/02/18 125 lb (56.7 kg)  02/08/18 126 lb (57.2 kg)  01/06/18 128 lb (58.1 kg)    No results found for: TSH Lab Results  Component Value Date   WBC 8.0 10/07/2017   HGB 13.4 10/07/2017   HCT 38.3 (L) 10/07/2017   MCV 96.7 10/07/2017   PLT 173 10/07/2017   Lab Results  Component Value Date   NA 139 10/07/2017   K 3.6 10/07/2017   CO2 26 10/07/2017   GLUCOSE 97 10/07/2017   BUN 12 10/07/2017   CREATININE 0.89 10/07/2017   BILITOT 0.3 10/07/2017   ALKPHOS 65 10/07/2017   AST 17 10/07/2017   ALT 17 10/07/2017   PROT 7.1 10/07/2017   ALBUMIN 4.3 10/07/2017   CALCIUM 8.7 (L) 10/07/2017   ANIONGAP 10 10/07/2017   No results found for: CHOL No results found for: HDL No results found for: LDLCALC No results found for: TRIG No results found for: CHOLHDL No results found for: HGBA1C     Assessment & Plan:  1. Hearing loss due to cerumen impaction, bilateral Unable to retrieve with Curette. Lavage attempted x 2 with  removal of some ear wax. Still significant amount but had to stop due to tolerability. Patient to start ear wax softener and home lavage kit this weekend. If not improved further, will need suction/removal at ENT.  2. Tobacco use disorder, continuous Discussed options. Failed Nicoderm and Nicorette. Does not want to attempt Chantix. - buPROPion (WELLBUTRIN XL) 150 MG 24 hr tablet; Take 1 tablet (150 mg total) by mouth daily.  Dispense: 30 tablet; Refill: 2  No orders  of the defined types were placed in this encounter.    [name], PA-C

## 2018-04-02 NOTE — Patient Instructions (Signed)
Please start the Wellbutrin XL daily as directed for smoking cessation. Follow-up in 1 month for reassessment.  Continue the wax softener over the weekend and use a home irrigation kit to flush out the remaining wax. If no further improvement over the weekend, we will need to set you up with ENT for suction.    Earwax Buildup, Adult The ears produce a substance called earwax that helps keep bacteria out of the ear and protects the skin in the ear canal. Occasionally, earwax can build up in the ear and cause discomfort or hearing loss. What increases the risk? This condition is more likely to develop in people who:  Are male.  Are elderly.  Naturally produce more earwax.  Clean their ears often with cotton swabs.  Use earplugs often.  Use in-ear headphones often.  Wear hearing aids.  Have narrow ear canals.  Have earwax that is overly thick or sticky.  Have eczema.  Are dehydrated.  Have excess hair in the ear canal.  What are the signs or symptoms? Symptoms of this condition include:  Reduced or muffled hearing.  A feeling of fullness in the ear or feeling that the ear is plugged.  Fluid coming from the ear.  Ear pain.  Ear itch.  Ringing in the ear.  Coughing.  An obvious piece of earwax that can be seen inside the ear canal.  How is this diagnosed? This condition may be diagnosed based on:  Your symptoms.  Your medical history.  An ear exam. During the exam, your health care provider will look into your ear with an instrument called an otoscope.  You may have tests, including a hearing test. How is this treated? This condition may be treated by:  Using ear drops to soften the earwax.  Having the earwax removed by a health care provider. The health care provider may: ? Flush the ear with water. ? Use an instrument that has a loop on the end (curette). ? Use a suction device.  Surgery to remove the wax buildup. This may be done in severe  cases.  Follow these instructions at home:  Take over-the-counter and prescription medicines only as told by your health care provider.  Do not put any objects, including cotton swabs, into your ear. You can clean the opening of your ear canal with a washcloth or facial tissue.  Follow instructions from your health care provider about cleaning your ears. Do not over-clean your ears.  Drink enough fluid to keep your urine clear or pale yellow. This will help to thin the earwax.  Keep all follow-up visits as told by your health care provider. If earwax builds up in your ears often or if you use hearing aids, consider seeing your health care provider for routine, preventive ear cleanings. Ask your health care provider how often you should schedule your cleanings.  If you have hearing aids, clean them according to instructions from the manufacturer and your health care provider. Contact a health care provider if:  You have ear pain.  You develop a fever.  You have blood, pus, or other fluid coming from your ear.  You have hearing loss.  You have ringing in your ears that does not go away.  Your symptoms do not improve with treatment.  You feel like the room is spinning (vertigo). Summary  Earwax can build up in the ear and cause discomfort or hearing loss.  The most common symptoms of this condition include reduced or muffled hearing and  a feeling of fullness in the ear or feeling that the ear is plugged.  This condition may be diagnosed based on your symptoms, your medical history, and an ear exam.  This condition may be treated by using ear drops to soften the earwax or by having the earwax removed by a health care provider.  Do not put any objects, including cotton swabs, into your ear. You can clean the opening of your ear canal with a washcloth or facial tissue. This information is not intended to replace advice given to you by your health care provider. Make sure you  discuss any questions you have with your health care provider. Document Released: 06/12/2004 Document Revised: 07/16/2016 Document Reviewed: 07/16/2016 Elsevier Interactive Patient Education  [employer].

## 2018-05-02 ENCOUNTER — Emergency Department (HOSPITAL_COMMUNITY)
Admission: EM | Admit: 2018-05-02 | Discharge: 2018-05-02 | Disposition: A | Discharge status: Home / Self Care | Payer: 59 | Attending: Emergency Medicine | Admitting: Emergency Medicine

## 2018-05-02 ENCOUNTER — Emergency Department (HOSPITAL_COMMUNITY): Payer: 59

## 2018-05-02 ENCOUNTER — Encounter (HOSPITAL_COMMUNITY): Payer: Self-pay | Admitting: *Deleted

## 2018-05-02 ENCOUNTER — Other Ambulatory Visit: Payer: Self-pay

## 2018-05-02 DIAGNOSIS — F17210 Nicotine dependence, cigarettes, uncomplicated: Secondary | ICD-10-CM | POA: Diagnosis not present

## 2018-05-02 DIAGNOSIS — Z79899 Other long term (current) drug therapy: Secondary | ICD-10-CM | POA: Insufficient documentation

## 2018-05-02 DIAGNOSIS — R109 Unspecified abdominal pain: Secondary | ICD-10-CM | POA: Diagnosis present

## 2018-05-02 DIAGNOSIS — J45909 Unspecified asthma, uncomplicated: Secondary | ICD-10-CM | POA: Diagnosis not present

## 2018-05-02 DIAGNOSIS — R197 Diarrhea, unspecified: Secondary | ICD-10-CM | POA: Diagnosis not present

## 2018-05-02 DIAGNOSIS — R112 Nausea with vomiting, unspecified: Secondary | ICD-10-CM | POA: Diagnosis not present

## 2018-05-02 LAB — URINALYSIS, ROUTINE W REFLEX MICROSCOPIC
Bacteria, UA: NONE SEEN
Bilirubin Urine: NEGATIVE
GLUCOSE, UA: NEGATIVE mg/dL
HGB URINE DIPSTICK: NEGATIVE
KETONES UR: 20 mg/dL — AB
LEUKOCYTES UA: NEGATIVE
NITRITE: NEGATIVE
PROTEIN: 30 mg/dL — AB
Specific Gravity, Urine: 1.027 (ref 1.005–1.030)
pH: 7 (ref 5.0–8.0)

## 2018-05-02 LAB — CBC WITH DIFFERENTIAL/PLATELET
ABS IMMATURE GRANULOCYTES: 0.04 10*3/uL (ref 0.00–0.07)
BASOS ABS: 0 10*3/uL (ref 0.0–0.1)
Basophils Relative: 0 %
Eosinophils Absolute: 0 10*3/uL (ref 0.0–0.5)
Eosinophils Relative: 0 %
HCT: 41.9 % (ref 39.0–52.0)
HEMOGLOBIN: 14.5 g/dL (ref 13.0–17.0)
Immature Granulocytes: 0 %
LYMPHS PCT: 14 %
Lymphs Abs: 1.8 10*3/uL (ref 0.7–4.0)
MCH: 33.1 pg (ref 26.0–34.0)
MCHC: 34.6 g/dL (ref 30.0–36.0)
MCV: 95.7 fL (ref 80.0–100.0)
MONO ABS: 1.2 10*3/uL — AB (ref 0.1–1.0)
Monocytes Relative: 9 %
NEUTROS ABS: 10.1 10*3/uL — AB (ref 1.7–7.7)
NRBC: 0 % (ref 0.0–0.2)
Neutrophils Relative %: 77 %
Platelets: 187 10*3/uL (ref 150–400)
RBC: 4.38 MIL/uL (ref 4.22–5.81)
RDW: 12.4 % (ref 11.5–15.5)
WBC: 13.2 10*3/uL — ABNORMAL HIGH (ref 4.0–10.5)

## 2018-05-02 LAB — COMPREHENSIVE METABOLIC PANEL
ALBUMIN: 4.4 g/dL (ref 3.5–5.0)
ALT: 21 U/L (ref 0–44)
AST: 22 U/L (ref 15–41)
Alkaline Phosphatase: 58 U/L (ref 38–126)
Anion gap: 15 (ref 5–15)
BUN: 12 mg/dL (ref 6–20)
CHLORIDE: 94 mmol/L — AB (ref 98–111)
CO2: 29 mmol/L (ref 22–32)
CREATININE: 1.09 mg/dL (ref 0.61–1.24)
Calcium: 9 mg/dL (ref 8.9–10.3)
GFR calc non Af Amer: >60 mL/min (ref >60)
Glucose, Bld: 107 mg/dL — ABNORMAL HIGH (ref 70–99)
Potassium: 3.4 mmol/L — ABNORMAL LOW (ref 3.5–5.1)
SODIUM: 138 mmol/L (ref 135–145)
Total Bilirubin: 0.9 mg/dL (ref 0.3–1.2)
Total Protein: 7.3 g/dL (ref 6.5–8.1)

## 2018-05-02 LAB — LIPASE, BLOOD: Lipase: 30 U/L (ref 11–51)

## 2018-05-02 MED ORDER — SODIUM CHLORIDE 0.9 % IV BOLUS
1000.0000 mL | Freq: Once | INTRAVENOUS | Status: AC
  Administered 2018-05-02: 1000 mL via INTRAVENOUS

## 2018-05-02 MED ORDER — PROMETHAZINE HCL 25 MG/ML IJ SOLN
12.5000 mg | Freq: Once | INTRAMUSCULAR | Status: AC
  Administered 2018-05-02: 25 mg via INTRAVENOUS
  Filled 2018-05-02: qty 1

## 2018-05-02 MED ORDER — ONDANSETRON HCL 4 MG PO TABS: 4.0000 mg | ORAL_TABLET | Freq: Four times a day (QID) | ORAL | 0 refills | Status: DC

## 2018-05-02 MED ORDER — SODIUM CHLORIDE 0.9 % IV BOLUS
500.0000 mL | Freq: Once | INTRAVENOUS | Status: AC
  Administered 2018-05-02: 500 mL via INTRAVENOUS

## 2018-05-02 MED ORDER — MORPHINE SULFATE (PF) 4 MG/ML IV SOLN
4.0000 mg | Freq: Once | INTRAVENOUS | Status: AC
  Administered 2018-05-02: 4 mg via INTRAVENOUS
  Filled 2018-05-02: qty 1

## 2018-05-02 MED ORDER — DICYCLOMINE HCL 20 MG PO TABS: 20.0000 mg | ORAL_TABLET | Freq: Two times a day (BID) | ORAL | 0 refills | Status: DC

## 2018-05-02 MED ORDER — ONDANSETRON HCL 4 MG/2ML IJ SOLN
4.0000 mg | Freq: Once | INTRAMUSCULAR | Status: AC
  Administered 2018-05-02: 4 mg via INTRAVENOUS
  Filled 2018-05-02: qty 2

## 2018-05-02 NOTE — ED Notes (Signed)
States that he went to [employer] and then felt sick, vomited on the  Plane ride   Home , rt side abd pain, pt tearful , states haS NOT VOIDED

## 2018-05-02 NOTE — ED Notes (Signed)
Pt stable, ambulatory, states understanding of discharge instructions 

## 2018-05-02 NOTE — ED Triage Notes (Signed)
Pt reports having n/v x 4 days. Initially had diarrhea which has now resolved but reports decreased urinary output.

## 2018-05-02 NOTE — ED Notes (Signed)
Patient transported to CT 

## 2018-05-02 NOTE — ED Provider Notes (Signed)
  Physical Exam  BP 133/77 (BP Location: Right Arm)   Pulse 98   Temp 98.3 F (36.8 C) (Oral)   Resp (!) 100   SpO2 97%   Physical Exam  ED Course/Procedures     Procedures  MDM  3:06 PM Patient care received from [facility], please see his note for a full HPI. Briefly, patient had sushi in [place] couple of days ago, multiple episodes of diarrhea. CT was unremarkable. His disposition is pending UA and PO challenge. He has good follow up with PCP.  5:21 PM Pt reassessed by me, reports he is feeling better after his second liter of fluids, will PO challenge at this time prior to discharge. Rx provided by Barth Kirks. PA.  Reevaluated by me patient reports he is been able to retain his ginger ale at this time.  He would like to call wife in order to go home.  I will provide patient with prescriptions for the medication to my colleague order.  His vitals are stable prior to discharge.  Respirations were charted at 100, I believe this is a mistake will ask nursing staff to correct prior to discharge.     Janeece Fitting, PA-C 05/02/18 1730    Margette Fast, MD 05/02/18 2016

## 2018-05-02 NOTE — ED Provider Notes (Signed)
[facility] EMERGENCY DEPARTMENT Provider Note   CSN: [id number] Arrival date & time: 05/02/18  1243     History   Chief Complaint Chief Complaint  Patient presents with  . Emesis    HPI Paul Arnold is a 37 y.o. male with a history of prior appendectomy presents emergency department today for abdominal pain, nausea and vomiting.  Patient reports that 4 days ago he flew out to [facility] for a work conference.  His coworkers and him were at [employer] where they had raw tuna.  The all shared the meal.  Patient also reports that he had 2 alcoholic beverages at night.  He reports that all 5 members of his work team shortly after began having abdominal pain, nausea, vomiting and diarrhea.  He reports one episode of watery like diarrhea that was nonbloody on the first night.  He reports no bowel movement since that time.  He reports 10+ episodes of emesis per day since onset with the last approximately 30 minutes ago.  He does report some social abdominal pain with this.  He reports that is intermittent and usually occurs before he feels the urge to emesis.  He reports it is usually in his right flank, right upper abdomen, epigastric area as well as his periumbilical of his abdomen.  He denies any focal area of abdominal pain but notes it is on the left side.  He denies a history of similar pain in the past.  He reports that since yesterday morning he has been unable to urinate which concerned him and was what brought him to the emergency department today.  He has been trying to eat ice chips but reports he is vomited this back up.  He is tried NyQuil and Pepto-Bismol for symptoms without any relief.  Patient reports that he does feel hot at times but denies any fever.  He does report some chills.  He denies any shortness of breath, cough, melena, hematochezia, hematuria, dysuria.  He has no other complaints at this time.  He rates his current pain level as 7/10 and rates as  sharp in nature. No aggravating or relieving factors.   HPI  Past Medical History:  Diagnosis Date  . Appendicitis   . Asthma   . Periodontitis     Patient Active Problem List   Diagnosis Date Noted  . GAD (generalized anxiety disorder) 01/06/2018  . Acute appendicitis 04/23/2015    Past Surgical History:  Procedure Laterality Date  . APPENDECTOMY  04/23/2015   laproscopic   . LAPAROSCOPIC APPENDECTOMY N/A 04/23/2015   Procedure: APPENDECTOMY LAPAROSCOPIC;  Surgeon: Judeth Horn, MD;  Location: [facility];  Service: General;  Laterality: N/A;        Home Medications    Prior to Admission medications   Medication Sig Start Date End Date Taking? Authorizing Provider  beclomethasone (QVAR) 80 MCG/ACT inhaler Inhale 1 puff into the lungs 2 (two) times daily. 02/15/18   Brunetta Jeans, PA-C  buPROPion (WELLBUTRIN XL) 150 MG 24 hr tablet Take 1 tablet (150 mg total) by mouth daily. 04/02/18   Brunetta Jeans, PA-C  clonazePAM (KLONOPIN) 0.5 MG tablet Take 1 tablet (0.5 mg total) by mouth daily as needed. 02/15/18   Brunetta Jeans, PA-C  fluticasone (FLONASE) 50 MCG/ACT nasal spray Place 2 sprays into both nostrils daily. 01/06/18   Brunetta Jeans, PA-C    Family History History reviewed. No pertinent family history.  Social History Social History  Tobacco Use  . Smoking status: Current Every Day Smoker    Packs/day: 0.50    Years: 10.00    Pack years: 5.00    Types: Cigarettes  . Smokeless tobacco: Never Used  Substance Use Topics  . Alcohol use: No  . Drug use: Yes    Frequency: 3.0 times per week    Types: Marijuana     Allergies   Contrast media [iodinated diagnostic agents] and Penicillins   Review of Systems Review of Systems  All other systems reviewed and are negative.    Physical Exam Updated Vital Signs BP 133/77 (BP Location: Right Arm)   Pulse 98   Temp 98.3 F (36.8 C) (Oral)   Resp (!) 100   SpO2 97%   Physical Exam Vitals signs  and nursing note reviewed.  Constitutional:      Appearance: He is well-developed. He is not diaphoretic.  HENT:     Head: Normocephalic and atraumatic.     Right Ear: External ear normal.     Left Ear: External ear normal.     Nose: Nose normal.     Mouth/Throat:     Pharynx: Uvula midline.     Tonsils: No tonsillar exudate.     Comments: Mucous membranes are dry Eyes:     General: No scleral icterus.       Right eye: No discharge.        Left eye: No discharge.     Pupils: Pupils are equal, round, and reactive to light.  Neck:     Musculoskeletal: Neck supple. Normal range of motion. No neck rigidity or spinous process tenderness.     Trachea: Trachea normal.     Comments: No nuchal rigidity or meningismus Cardiovascular:     Rate and Rhythm: Normal rate and regular rhythm.     Pulses:          Radial pulses are 2+ on the right side and 2+ on the left side.       Dorsalis pedis pulses are 2+ on the right side and 2+ on the left side.       Posterior tibial pulses are 2+ on the right side and 2+ on the left side.     Heart sounds: No murmur.     Comments: No lower extremity swelling or edema. Calves symmetric in size bilaterally. Pulmonary:     Effort: Pulmonary effort is normal.     Breath sounds: Normal breath sounds.  Chest:     Chest wall: No tenderness.  Abdominal:     General: Bowel sounds are normal.     Palpations: Abdomen is soft.     Tenderness: There is abdominal tenderness in the right upper quadrant, right lower quadrant, epigastric area and periumbilical area. There is right CVA tenderness. There is no left CVA tenderness, guarding or rebound. Negative signs include Murphy's sign.  Lymphadenopathy:     Cervical: No cervical adenopathy.  Skin:    General: Skin is warm and dry.     Findings: No rash. Rash is not vesicular.  Neurological:     Mental Status: He is alert.      ED Treatments / Results  Labs (all labs ordered are listed, but only abnormal  results are displayed) Labs Reviewed  COMPREHENSIVE METABOLIC PANEL - Abnormal; Notable for the following components:      Result Value   Potassium 3.4 (*)    Chloride 94 (*)    Glucose, Bld 107 (*)  All other components within normal limits  CBC WITH DIFFERENTIAL/PLATELET - Abnormal; Notable for the following components:   WBC 13.2 (*)    Neutro Abs 10.1 (*)    Monocytes Absolute 1.2 (*)    All other components within normal limits  LIPASE, BLOOD  URINALYSIS, ROUTINE W REFLEX MICROSCOPIC    EKG None  Radiology No results found.  Procedures Procedures (including critical care time)  Medications Ordered in ED Medications  sodium chloride 0.9 % bolus 1,000 mL (1,000 mLs Intravenous New Bag/Given 05/02/18 1446)  morphine 4 MG/ML injection 4 mg (4 mg Intravenous Given 05/02/18 1327)  ondansetron (ZOFRAN) injection 4 mg (4 mg Intravenous Given 05/02/18 1326)  sodium chloride 0.9 % bolus 1,000 mL (0 mLs Intravenous Stopped 05/02/18 1446)  morphine 4 MG/ML injection 4 mg (4 mg Intravenous Given 05/02/18 1449)  promethazine (PHENERGAN) injection 12.5-25 mg (25 mg Intravenous Given 05/02/18 1452)     Initial Impression / Assessment and Plan / ED Course  I have reviewed the triage vital signs and the nursing notes.  Pertinent labs & imaging results that were available during my care of the patient were reviewed by me and considered in my medical decision making (see chart for details).     37 year old male with prior appendectomy presenting to the emergency room today for abdominal pain, nausea, vomiting inability urinate.  Patient reports 4 days ago he was [facility] and had brought to an hour some coworkers.  He reports all 5 members the team shortly after began developing abdominal pain, nausea, vomiting and diarrhea.  He reports one episode of diarrhea on the initial day.  No bowel movement since.  He has had 10+ episodes of nonbilious, nonbloody emesis since that time.  He  reports some right-sided abdominal pain as well as flank pain.  He denies history of similar pain.  He reports yesterday he stopped being able to urinate.  He reports feeling hot and cold but denies any fevers.  On presentation patient is with reassuring vital signs (rr entered as 100 but supposed to be 10).  Patient does appear dehydrated on exam with dry mucous membranes.  Patient is with right CVA tenderness.  He also has epigastric, periumbilical and right-sided tenderness.  He has a negative Murphy sign.  Patient's history makes cholecystitis less suspicious.  Will correlate with labs.  Will obtain labs as well as CT renal stone study.  Will obtain bladder scan.  Will give fluids, nausea and pain medication.    Bladder scan was done at bedside shows a decompressed bladder.  No evidence of retention.  Patient does have a leukocytosis of 13.2.  LFTs within normal limits.  Bilirubin within normal limits. Do not suspect gallbladder pathology at this time. No acute kidney injury.  Anion gap within normal limits.  No evidence of DKA.  Patient with mild hypokalemia of 3.4.  No hyponatremia.  Lipase within normal limits.  After patient's return from CT, he is received 1.5 L of fluid, pain medication and nausea medication.  He reports that his pain is near resolution however he still feels severely nauseous.  He feels the fluid is helping.  Will order more pain medication also give Phenergan.  With CT and UA pending, case signed out to [facility] with disposition pending.  If CT and UA are unremarkable and patient is able to tolerate p.o. fluids at time of discharge plan to have follow-up with PCP.  If significant abnormalities are found, plan to follow-up as  appropriate disposition as PA feels appropriate.   Final Clinical Impressions(s) / ED Diagnoses   Final diagnoses:  None    ED Discharge Orders    None       Jillyn Ledger, Hershal Coria 05/02/18 1511    Margette Fast, MD 05/02/18 2016

## 2018-05-02 NOTE — ED Notes (Signed)
Pt states unable to void at this time, urinal and call light at bedside

## 2018-05-06 ENCOUNTER — Encounter: Payer: Self-pay | Admitting: Physician Assistant

## 2018-05-07 ENCOUNTER — Encounter: Payer: Self-pay | Admitting: Physician Assistant

## 2018-05-07 ENCOUNTER — Other Ambulatory Visit: Payer: Self-pay

## 2018-05-07 ENCOUNTER — Ambulatory Visit (INDEPENDENT_AMBULATORY_CARE_PROVIDER_SITE_OTHER): Payer: 59 | Admitting: Physician Assistant

## 2018-05-07 VITALS — BP 110/68 | HR 72 | Temp 97.9°F | Resp 14 | Ht 67.0 in | Wt 121.0 lb

## 2018-05-07 DIAGNOSIS — Z0000 Encounter for general adult medical examination without abnormal findings: Secondary | ICD-10-CM | POA: Diagnosis not present

## 2018-05-07 DIAGNOSIS — F411 Generalized anxiety disorder: Secondary | ICD-10-CM

## 2018-05-07 LAB — CBC WITH DIFFERENTIAL/PLATELET
BASOS PCT: 0.7 % (ref 0.0–3.0)
Basophils Absolute: 0 10*3/uL (ref 0.0–0.1)
Eosinophils Absolute: 0.2 10*3/uL (ref 0.0–0.7)
Eosinophils Relative: 3.8 % (ref 0.0–5.0)
HCT: 42.1 % (ref 39.0–52.0)
Hemoglobin: 14.4 g/dL (ref 13.0–17.0)
Lymphocytes Relative: 30.4 % (ref 12.0–46.0)
Lymphs Abs: 1.9 10*3/uL (ref 0.7–4.0)
MCHC: 34.2 g/dL (ref 30.0–36.0)
MCV: 98.8 fl (ref 78.0–100.0)
Monocytes Absolute: 0.5 10*3/uL (ref 0.1–1.0)
Monocytes Relative: 7.7 % (ref 3.0–12.0)
NEUTROS ABS: 3.7 10*3/uL (ref 1.4–7.7)
Neutrophils Relative %: 57.4 % (ref 43.0–77.0)
Platelets: 167 10*3/uL (ref 150.0–400.0)
RBC: 4.26 Mil/uL (ref 4.22–5.81)
RDW: 12.8 % (ref 11.5–15.5)
WBC: 6.4 10*3/uL (ref 4.0–10.5)

## 2018-05-07 LAB — COMPREHENSIVE METABOLIC PANEL
ALT: 14 U/L (ref 0–53)
AST: 11 U/L (ref 0–37)
Albumin: 4.4 g/dL (ref 3.5–5.2)
Alkaline Phosphatase: 62 U/L (ref 39–117)
BUN: 11 mg/dL (ref 6–23)
CO2: 29 meq/L (ref 19–32)
Calcium: 9.2 mg/dL (ref 8.4–10.5)
Chloride: 104 mEq/L (ref 96–112)
Creatinine, Ser: 0.81 mg/dL (ref 0.40–1.50)
GFR: 113.83 mL/min (ref >60.00)
Glucose, Bld: 101 mg/dL — ABNORMAL HIGH (ref 70–99)
Potassium: 4 mEq/L (ref 3.5–5.1)
Sodium: 139 mEq/L (ref 135–145)
Total Bilirubin: 0.3 mg/dL (ref 0.2–1.2)
Total Protein: 6.6 g/dL (ref 6.0–8.3)

## 2018-05-07 LAB — HEMOGLOBIN A1C: Hgb A1c MFr Bld: 5.5 % (ref 4.6–6.5)

## 2018-05-07 LAB — LIPID PANEL
CHOL/HDL RATIO: 4
Cholesterol: 151 mg/dL (ref 0–200)
HDL: 38.4 mg/dL — ABNORMAL LOW (ref >39.00)
LDL Cholesterol: 95 mg/dL (ref 0–99)
NonHDL: 112.26
Triglycerides: 84 mg/dL (ref 0.0–149.0)
VLDL: 16.8 mg/dL (ref 0.0–40.0)

## 2018-05-07 MED ORDER — CLONAZEPAM 0.5 MG PO TABS: 0.5000 mg | ORAL_TABLET | Freq: Every day | ORAL | 1 refills | Status: DC | PRN

## 2018-05-07 NOTE — Progress Notes (Signed)
Patient presents to clinic today for annual exam.  Patient is fasting for labs.  Acute Concerns: Denies acute concerns.  Chronic Issues: GAD -- Currently on a regimen of Wellbutrin and Klonopin. Taking Wellbutrin daily as directed. Is only taking a couple of Klonopin per week. Notes more stress the close we get to the holidays. Denies SI/HI. Sleep is stable.   Health Maintenance: Immunizations -- Declines flu shot. Tetanus up-to-date.   Past Medical History:  Diagnosis Date  . Appendicitis   . Asthma   . Periodontitis     Past Surgical History:  Procedure Laterality Date  . APPENDECTOMY  04/23/2015   laproscopic   . LAPAROSCOPIC APPENDECTOMY N/A 04/23/2015   Procedure: APPENDECTOMY LAPAROSCOPIC;  Surgeon: Judeth Horn, MD;  Location: [facility];  Service: General;  Laterality: N/A;    Current Outpatient Medications on File Prior to Visit  Medication Sig Dispense Refill  . beclomethasone (QVAR) 80 MCG/ACT inhaler Inhale 1 puff into the lungs 2 (two) times daily. 10.6 g 0  . buPROPion (WELLBUTRIN XL) 150 MG 24 hr tablet Take 1 tablet (150 mg total) by mouth daily. 30 tablet 2  . clonazePAM (KLONOPIN) 0.5 MG tablet Take 1 tablet (0.5 mg total) by mouth daily as needed. 20 tablet 0  . dicyclomine (BENTYL) 20 MG tablet Take 1 tablet (20 mg total) by mouth 2 (two) times daily. 20 tablet 0  . ondansetron (ZOFRAN) 4 MG tablet Take 1 tablet (4 mg total) by mouth every 6 (six) hours. 12 tablet 0  . fluticasone (FLONASE) 50 MCG/ACT nasal spray Place 2 sprays into both nostrils daily. (Patient not taking: Reported on 05/07/2018) 16 g 6   No current facility-administered medications on file prior to visit.     Allergies  Allergen Reactions  . Contrast Media [Iodinated Diagnostic Agents] Hives and Itching    Itching , hives on 04-23-15 CT exam 05-10-15---pt got 1 hr emergent prep in ED for CT -dosed with Barium--no reaction noted  . Penicillins     Childhood allergy Has patient had a  PCN reaction causing immediate rash, facial/tongue/throat swelling, SOB or lightheadedness with hypotension: unknown Has patient had a PCN reaction causing severe rash involving mucus membranes or skin necrosis: unknown Has patient had a PCN reaction that required hospitalization unknown Has patient had a PCN reaction occurring within the last 10 years: unknown If all of the above answers are "NO", then may proceed with Cephalosporin use.     History reviewed. No pertinent family history.  Social History   Socioeconomic History  . Marital status: Married    Spouse name: Not on file  . Number of children: 2  . Years of education: Not on file  . Highest education level: Not on file  Occupational History  . Occupation: Agricultural consultant  Social Needs  . Financial resource strain: Not on file  . Food insecurity:    Worry: Not on file    Inability: Not on file  . Transportation needs:    Medical: Not on file    Non-medical: Not on file  Tobacco Use  . Smoking status: Current Every Day Smoker    Packs/day: 0.50    Years: 10.00    Pack years: 5.00    Types: Cigarettes  . Smokeless tobacco: Never Used  Substance and Sexual Activity  . Alcohol use: No  . Drug use: Yes    Frequency: 3.0 times per week    Types: Marijuana  . Sexual activity: Yes  Lifestyle  . Physical activity:    Days per week: Not on file    Minutes per session: Not on file  . Stress: Not on file  Relationships  . Social connections:    Talks on phone: Not on file    Gets together: Not on file    Attends religious service: Not on file    Active member of club or organization: Not on file    Attends meetings of clubs or organizations: Not on file    Relationship status: Not on file  . Intimate partner violence:    Fear of current or ex partner: Not on file    Emotionally abused: Not on file    Physically abused: Not on file    Forced sexual activity: Not on file  Other Topics Concern  . Not on file    Social History Narrative  . Not on file   Review of Systems  Constitutional: Negative for fever and weight loss.  HENT: Negative for ear discharge, ear pain, hearing loss and tinnitus.   Eyes: Negative for blurred vision, double vision, photophobia and pain.  Respiratory: Negative for cough and shortness of breath.   Cardiovascular: Negative for chest pain and palpitations.  Gastrointestinal: Negative for abdominal pain, blood in stool, constipation, diarrhea, heartburn, melena, nausea and vomiting.  Genitourinary: Negative for dysuria, flank pain, frequency, hematuria and urgency.  Musculoskeletal: Negative for falls.  Neurological: Negative for dizziness, loss of consciousness and headaches.  Endo/Heme/Allergies: Negative for environmental allergies.  Psychiatric/Behavioral: Negative for depression, hallucinations, substance abuse and suicidal ideas. The patient is nervous/anxious. The patient does not have insomnia.    BP 110/68   Temp 97.9 F (36.6 C) (Oral)   Resp 14   Ht 5' 7"  (1.702 m)   Wt 121 lb (54.9 kg)   BMI 18.95 kg/m   Physical Exam Vitals signs reviewed.  Constitutional:      General: He is not in acute distress.    Appearance: Normal appearance. He is well-developed. He is not diaphoretic.  HENT:     Head: Normocephalic and atraumatic.     Right Ear: Tympanic membrane, ear canal and external ear normal.     Left Ear: Tympanic membrane, ear canal and external ear normal.     Nose: Nose normal.     Mouth/Throat:     Pharynx: No posterior oropharyngeal erythema.  Eyes:     Conjunctiva/sclera: Conjunctivae normal.     Pupils: Pupils are equal, round, and reactive to light.  Neck:     Musculoskeletal: Neck supple.     Thyroid: No thyromegaly.  Cardiovascular:     Rate and Rhythm: Normal rate and regular rhythm.     Heart sounds: Normal heart sounds.  Pulmonary:     Effort: Pulmonary effort is normal. No respiratory distress.     Breath sounds: Normal  breath sounds. No wheezing or rales.  Chest:     Chest wall: No tenderness.  Abdominal:     General: Bowel sounds are normal. There is no distension.     Palpations: Abdomen is soft. There is no mass.     Tenderness: There is no abdominal tenderness. There is no guarding or rebound.  Lymphadenopathy:     Cervical: No cervical adenopathy.  Skin:    General: Skin is warm and dry.     Findings: No rash.  Neurological:     Mental Status: He is alert and oriented to person, place, and time.  Cranial Nerves: No cranial nerve deficit.     Recent Results (from the past 2160 hour(s))  Comprehensive metabolic panel     Status: Abnormal   Collection Time: 05/02/18  1:18 PM  Result Value Ref Range   Sodium 138 135 - 145 mmol/L   Potassium 3.4 (L) 3.5 - 5.1 mmol/L   Chloride 94 (L) 98 - 111 mmol/L   CO2 29 22 - 32 mmol/L   Glucose, Bld 107 (H) 70 - 99 mg/dL   BUN 12 6 - 20 mg/dL   Creatinine, Ser 1.09 0.61 - 1.24 mg/dL   Calcium 9.0 8.9 - 10.3 mg/dL   Total Protein 7.3 6.5 - 8.1 g/dL   Albumin 4.4 3.5 - 5.0 g/dL   AST 22 15 - 41 U/L   ALT 21 0 - 44 U/L   Alkaline Phosphatase 58 38 - 126 U/L   Total Bilirubin 0.9 0.3 - 1.2 mg/dL   GFR calc non Af Amer >60 >60 mL/min   GFR calc Af Amer >60 >60 mL/min   Anion gap 15 5 - 15    Comment: Performed at [facility], [address], [place], [place] [address]  Lipase, blood     Status: None   Collection Time: 05/02/18  1:18 PM  Result Value Ref Range   Lipase 30 11 - 51 U/L    Comment: Performed at [facility],  [address], [place], [place] [address]  CBC with Differential     Status: Abnormal   Collection Time: 05/02/18  1:18 PM  Result Value Ref Range   WBC 13.2 (H) 4.0 - 10.5 K/uL   RBC 4.38 4.22 - 5.81 MIL/uL   Hemoglobin 14.5 13.0 - 17.0 g/dL   HCT 41.9 39.0 - 52.0 %   MCV 95.7 80.0 - 100.0 fL   MCH 33.1 26.0 - 34.0 pg   MCHC 34.6 30.0 - 36.0 g/dL   RDW 12.4 11.5 - 15.5 %   Platelets 187 150 - 400 K/uL    nRBC 0.0 0.0 - 0.2 %   Neutrophils Relative % 77 %   Neutro Abs 10.1 (H) 1.7 - 7.7 K/uL   Lymphocytes Relative 14 %   Lymphs Abs 1.8 0.7 - 4.0 K/uL   Monocytes Relative 9 %   Monocytes Absolute 1.2 (H) 0.1 - 1.0 K/uL   Eosinophils Relative 0 %   Eosinophils Absolute 0.0 0.0 - 0.5 K/uL   Basophils Relative 0 %   Basophils Absolute 0.0 0.0 - 0.1 K/uL   Immature Granulocytes 0 %   Abs Immature Granulocytes 0.04 0.00 - 0.07 K/uL    Comment: Performed at [facility] [address], [place], [place] [address]  Urinalysis, Routine w reflex microscopic     Status: Abnormal   Collection Time: 05/02/18  4:19 PM  Result Value Ref Range   Color, Urine YELLOW YELLOW   APPearance CLEAR CLEAR   Specific Gravity, Urine 1.027 1.005 - 1.030   pH 7.0 5.0 - 8.0   Glucose, UA NEGATIVE NEGATIVE mg/dL   Hgb urine dipstick NEGATIVE NEGATIVE   Bilirubin Urine NEGATIVE NEGATIVE   Ketones, ur 20 (A) NEGATIVE mg/dL   Protein, ur 30 (A) NEGATIVE mg/dL   Nitrite NEGATIVE NEGATIVE   Leukocytes, UA NEGATIVE NEGATIVE   RBC / HPF 0-5 0 - 5 RBC/hpf   WBC, UA 0-5 0 - 5 WBC/hpf   Bacteria, UA NONE SEEN NONE SEEN   Squamous Epithelial / LPF 0-5  0 - 5   Mucus PRESENT     Comment: Performed at [facility], [place] [address] [place], [place] [address]   Assessment/Plan: 1. GAD (generalized anxiety disorder) Doing well. CSC updated/renewed today. Database reviewed and no red flags. Medications refilled. Follow-up 6 months. - clonazePAM (KLONOPIN) 0.5 MG tablet; Take 1 tablet (0.5 mg total) by mouth daily as needed.  Dispense: 20 tablet; Refill: 1  2. Visit for preventive health examination Depression screen negative. Health Maintenance reviewed - Declines flu shot. Tetanus up-to-date. Preventive schedule discussed and handout given in AVS. Will obtain fasting labs today.  - CBC with Differential/Platelet - Comprehensive metabolic panel - Lipid panel - Hemoglobin A1c   [name],  PA-C

## 2018-05-07 NOTE — Patient Instructions (Signed)
-Please go to the lab for blood work.  -Our office will call you with your results unless you have chosen to receive results via MyChart. -If your blood work is normal we will follow-up each year for physicals and as scheduled for chronic medical problems. -If anything is abnormal we will treat accordingly and get you in for a follow-up.   Preventive Care 18-39 Years, Male Preventive care refers to lifestyle choices and visits with your health care provider that can promote health and wellness. What does preventive care include?   A yearly physical exam. This is also called an annual well check.  Dental exams once or twice a year.  Routine eye exams. Ask your health care provider how often you should have your eyes checked.  Personal lifestyle choices, including: ? Daily care of your teeth and gums. ? Regular physical activity. ? Eating a healthy diet. ? Avoiding tobacco and drug use. ? Limiting alcohol use. ? Practicing safe sex. What happens during an annual well check? The services and screenings done by your health care provider during your annual well check will depend on your age, overall health, lifestyle risk factors, and family history of disease. Counseling Your health care provider may ask you questions about your:  Alcohol use.  Tobacco use.  Drug use.  Emotional well-being.  Home and relationship well-being.  Sexual activity.  Eating habits.  Work and work environment. Screening You may have the following tests or measurements:  Height, weight, and BMI.  Blood pressure.  Lipid and cholesterol levels. These may be checked every 5 years starting at age 20.  Diabetes screening. This is done by checking your blood sugar (glucose) after you have not eaten for a while (fasting).  Skin check.  Hepatitis C blood test.  Hepatitis B blood test.  Sexually transmitted disease (STD) testing. Discuss your test results, treatment options, and if necessary,  the need for more tests with your health care provider. Vaccines Your health care provider may recommend certain vaccines, such as:  Influenza vaccine. This is recommended every year.  Tetanus, diphtheria, and acellular pertussis (Tdap, Td) vaccine. You may need a Td booster every 10 years.  Varicella vaccine. You may need this if you have not been vaccinated.  HPV vaccine. If you are 26 or younger, you may need three doses over 6 months.  Measles, mumps, and rubella (MMR) vaccine. You may need at least one dose of MMR.You may also need a second dose.  Pneumococcal 13-valent conjugate (PCV13) vaccine. You may need this if you have certain conditions and have not been vaccinated.  Pneumococcal polysaccharide (PPSV23) vaccine. You may need one or two doses if you smoke cigarettes or if you have certain conditions.  Meningococcal vaccine. One dose is recommended if you are age 19-21 years and a first-year college student living in a residence hall, or if you have one of several medical conditions. You may also need additional booster doses.  Hepatitis A vaccine. You may need this if you have certain conditions or if you travel or work in places where you may be exposed to hepatitis A.  Hepatitis B vaccine. You may need this if you have certain conditions or if you travel or work in places where you may be exposed to hepatitis B.  Haemophilus influenzae type b (Hib) vaccine. You may need this if you have certain risk factors. Talk to your health care provider about which screenings and vaccines you need and how often you need them.   This information is not intended to replace advice given to you by your health care provider. Make sure you discuss any questions you have with your health care provider. Document Released: 07/01/2001 Document Revised: 12/16/2016 Document Reviewed: 03/06/2015 Elsevier Interactive Patient Education  2019 Elsevier Inc.   

## 2018-05-24 ENCOUNTER — Encounter

## 2018-05-24 ENCOUNTER — Ambulatory Visit: Payer: 59 | Admitting: Family Medicine

## 2018-06-02 ENCOUNTER — Encounter: Payer: Self-pay | Admitting: Physician Assistant

## 2018-06-02 ENCOUNTER — Ambulatory Visit: Payer: 59 | Admitting: Physician Assistant

## 2018-06-02 ENCOUNTER — Other Ambulatory Visit: Payer: Self-pay

## 2018-06-02 VITALS — BP 90/60 | HR 80 | Temp 98.2°F | Resp 16 | Ht 67.0 in | Wt 122.0 lb

## 2018-06-02 DIAGNOSIS — R6889 Other general symptoms and signs: Secondary | ICD-10-CM

## 2018-06-02 LAB — POC INFLUENZA A&B (BINAX/QUICKVUE)
Influenza A, POC: NEGATIVE
Influenza B, POC: NEGATIVE

## 2018-06-02 MED ORDER — BENZONATATE 100 MG PO CAPS: 100.0000 mg | ORAL_CAPSULE | Freq: Two times a day (BID) | ORAL | 0 refills | Status: DC | PRN

## 2018-06-02 MED ORDER — OSELTAMIVIR PHOSPHATE 75 MG PO CAPS: 75.0000 mg | ORAL_CAPSULE | Freq: Two times a day (BID) | ORAL | 0 refills | Status: DC

## 2018-06-02 NOTE — Patient Instructions (Signed)
  Based on what you have shared with me it looks like you may have a respiratory virus that may be influenza.  Influenza or "the flu" is   an infection caused by a respiratory virus. The flu virus is highly contagious and persons who did not receive their yearly flu vaccination may "catch" the flu from close contact.  We have anti-viral medications to treat the viruses that cause this infection. They are not a "cure" and only shorten the course of the infection. These prescriptions are most effective when they are given within the first 2 days of "flu" symptoms. Antiviral medication are indicated if you have a high risk of complications from the flu. You should  also consider an antiviral medication if you are in close contact with someone who is at risk. These medications can help patients avoid complications from the flu  but have side effects that you should know. Possible side effects from Tamiflu or oseltamivir include nausea, vomiting, diarrhea, dizziness, headaches, eye redness, sleep problems or other respiratory symptoms. You should not take Tamiflu if you have an allergy to oseltamivir or any to the ingredients in Tamiflu.  Based upon your symptoms and potential risk factors I have prescribed Oseltamivir (Tamiflu).  It has been sent to your designated pharmacy.  You will take one 75 mg capsule orally twice a day for the next 5 days.  ANYONE WHO HAS FLU SYMPTOMS SHOULD: . Stay home. The flu is highly contagious and going out or to work exposes others! . Be sure to drink plenty of fluids. Water is fine as well as fruit juices, sodas and electrolyte beverages. You may want to stay away from caffeine or alcohol. If you are nauseated, try taking small sips of liquids. How do you know if you are getting enough fluid? Your urine should be a pale yellow or almost colorless. . Get rest. . Taking a steamy shower or using a humidifier may help nasal congestion and ease sore throat pain. Using a saline  nasal spray works much the same way. . Cough drops, hard candies and sore throat lozenges may ease your cough. . Line up a caregiver. Have someone check on you regularly.   GET HELP RIGHT AWAY IF: . You cannot keep down liquids or your medications. . You become short of breath . Your fell like you are going to pass out or loose consciousness. . Your symptoms persist after you have completed your treatment plan MAKE SURE YOU   Understand these instructions.  Will watch your condition.  Will get help right away if you are not doing well or get worse.

## 2018-06-02 NOTE — Progress Notes (Signed)
Acute Office Visit  Subjective:    Patient ID: Paul Arnold, male    DOB: [birth year], 38 y.o.   MRN: [id number]  Chief Complaint  Patient presents with  . URI    Pt endorses cough, fatigue, chills, fever (at home 100.5 F yesterday), runny nose (relieved with benadryl), body aches, vomiting (Four times in the last 24 hours), diarrhea (started Tuesday morning and has had normal bowel movements since yesterday).  Benadryl is providing some relief but Alka Seltzer and  Pepto are not.    URI   Cough: productive with brown mucus. Diarrhea: has resolved as of yesterday - has had normal bowel movement      Past Medical History:  Diagnosis Date  . Appendicitis   . Asthma   . Periodontitis     Past Surgical History:  Procedure Laterality Date  . APPENDECTOMY  04/23/2015   laproscopic   . LAPAROSCOPIC APPENDECTOMY N/A 04/23/2015   Procedure: APPENDECTOMY LAPAROSCOPIC;  Surgeon: Judeth Horn, MD;  Location: [facility];  Service: General;  Laterality: N/A;    History reviewed. No pertinent family history.  Social History   Socioeconomic History  . Marital status: Married    Spouse name: Not on file  . Number of children: 2  . Years of education: Not on file  . Highest education level: Not on file  Occupational History  . Occupation: Agricultural consultant  Social Needs  . Financial resource strain: Not on file  . Food insecurity:    Worry: Not on file    Inability: Not on file  . Transportation needs:    Medical: Not on file    Non-medical: Not on file  Tobacco Use  . Smoking status: Current Every Day Smoker    Packs/day: 0.50    Years: 10.00    Pack years: 5.00    Types: Cigarettes  . Smokeless tobacco: Never Used  Substance and Sexual Activity  . Alcohol use: No  . Drug use: Yes    Frequency: 3.0 times per week    Types: Marijuana  . Sexual activity: Yes  Lifestyle  . Physical activity:    Days per week: Not on file    Minutes per session: Not on file  . Stress: Not  on file  Relationships  . Social connections:    Talks on phone: Not on file    Gets together: Not on file    Attends religious service: Not on file    Active member of club or organization: Not on file    Attends meetings of clubs or organizations: Not on file    Relationship status: Not on file  . Intimate partner violence:    Fear of current or ex partner: Not on file    Emotionally abused: Not on file    Physically abused: Not on file    Forced sexual activity: Not on file  Other Topics Concern  . Not on file  Social History Narrative  . Not on file    Outpatient Medications Prior to Visit  Medication Sig Dispense Refill  . beclomethasone (QVAR) 80 MCG/ACT inhaler Inhale 1 puff into the lungs 2 (two) times daily. 10.6 g 0  . buPROPion (WELLBUTRIN XL) 150 MG 24 hr tablet Take 1 tablet (150 mg total) by mouth daily. 30 tablet 2  . clonazePAM (KLONOPIN) 0.5 MG tablet Take 1 tablet (0.5 mg total) by mouth daily as needed. 20 tablet 1  . ondansetron (ZOFRAN) 4 MG tablet Take 1 tablet (  4 mg total) by mouth every 6 (six) hours. 12 tablet 0  . dicyclomine (BENTYL) 20 MG tablet Take 1 tablet (20 mg total) by mouth 2 (two) times daily. (Patient not taking: Reported on 06/02/2018) 20 tablet 0   No facility-administered medications prior to visit.     Allergies  Allergen Reactions  . Contrast Media [Iodinated Diagnostic Agents] Hives and Itching    Itching , hives on 04-23-15 CT exam 05-10-15---pt got 1 hr emergent prep in ED for CT -dosed with Barium--no reaction noted  . Penicillins     Childhood allergy Has patient had a PCN reaction causing immediate rash, facial/tongue/throat swelling, SOB or lightheadedness with hypotension: unknown Has patient had a PCN reaction causing severe rash involving mucus membranes or skin necrosis: unknown Has patient had a PCN reaction that required hospitalization unknown Has patient had a PCN reaction occurring within the last 10 years: unknown If  all of the above answers are "NO", then may proceed with Cephalosporin use.     Review of Systems  Constitutional: Positive for chills, diaphoresis, fever and malaise/fatigue.  Respiratory: Positive for sputum production. Negative for shortness of breath. Cough: productive with brown mucus.   Cardiovascular: Negative for palpitations.  Gastrointestinal: Diarrhea: has resolved as of yesterday - has had normal bowel movement   Musculoskeletal: Positive for myalgias.      Objective:    Physical Exam  Constitutional: He appears ill.  HENT:  Nose: Right sinus exhibits frontal sinus tenderness. Left sinus exhibits frontal sinus tenderness.  Mouth/Throat: Uvula is midline. Posterior oropharyngeal erythema present.  Significant cerumen present bilaterally.   Neck: Neck supple.    Cardiovascular: Normal rate, regular rhythm and normal heart sounds.    BP 90/60   Pulse 80   Temp 98.2 F (36.8 C) (Oral)   Resp 16   Ht 5' 7"  (1.702 m)   Wt 55.3 kg   SpO2 98%   BMI 19.11 kg/m  Wt Readings from Last 3 Encounters:  06/02/18 55.3 kg  05/07/18 54.9 kg  04/02/18 56.7 kg    There are no preventive care reminders to display for this patient.  There are no preventive care reminders to display for this patient.   No results found for: TSH Lab Results  Component Value Date   WBC 6.4 05/07/2018   HGB 14.4 05/07/2018   HCT 42.1 05/07/2018   MCV 98.8 05/07/2018   PLT 167.0 05/07/2018   Lab Results  Component Value Date   NA 139 05/07/2018   K 4.0 05/07/2018   CO2 29 05/07/2018   GLUCOSE 101 (H) 05/07/2018   BUN 11 05/07/2018   CREATININE 0.81 05/07/2018   BILITOT 0.3 05/07/2018   ALKPHOS 62 05/07/2018   AST 11 05/07/2018   ALT 14 05/07/2018   PROT 6.6 05/07/2018   ALBUMIN 4.4 05/07/2018   CALCIUM 9.2 05/07/2018   ANIONGAP 15 05/02/2018   GFR 113.83 05/07/2018   Lab Results  Component Value Date   CHOL 151 05/07/2018   Lab Results  Component Value Date   HDL 38.40  (L) 05/07/2018   Lab Results  Component Value Date   LDLCALC 95 05/07/2018   Lab Results  Component Value Date   TRIG 84.0 05/07/2018   Lab Results  Component Value Date   CHOLHDL 4 05/07/2018   Lab Results  Component Value Date   HGBA1C 5.5 05/07/2018       Assessment & Plan:   1. Flu-like symptoms Flu swab negative but will  treat giving classic symptoms. No residual vomiting or loose stool. Supportive measures and OTC medications reviewed. Will begin Tessalon and Tamiflu. Strict return precautions reviewed with patient.  - benzonatate (TESSALON) 100 MG capsule; Take 1 capsule (100 mg total) by mouth 2 (two) times daily as needed for cough.  Dispense: 20 capsule; Refill: 0 - oseltamivir (TAMIFLU) 75 MG capsule; Take 1 capsule (75 mg total) by mouth 2 (two) times daily.  Dispense: 10 capsule; Refill: 0 - POC Influenza A&B(BINAX/QUICKVUE)   No orders of the defined types were placed in this encounter.    Erin E Mecum, Student-PA

## 2018-06-08 ENCOUNTER — Encounter: Payer: Self-pay | Admitting: Physician Assistant

## 2018-06-25 ENCOUNTER — Other Ambulatory Visit: Payer: Self-pay | Admitting: Physician Assistant

## 2018-06-25 DIAGNOSIS — F17209 Nicotine dependence, unspecified, with unspecified nicotine-induced disorders: Secondary | ICD-10-CM

## 2018-06-28 ENCOUNTER — Encounter: Payer: Self-pay | Admitting: Physician Assistant

## 2018-06-28 DIAGNOSIS — F411 Generalized anxiety disorder: Secondary | ICD-10-CM

## 2018-06-28 MED ORDER — CLONAZEPAM 0.5 MG PO TABS: 0.5000 mg | ORAL_TABLET | Freq: Every day | ORAL | 1 refills | Status: DC | PRN

## 2018-07-21 ENCOUNTER — Encounter: Payer: Self-pay | Admitting: Physician Assistant

## 2018-07-28 ENCOUNTER — Encounter: Payer: Self-pay | Admitting: Physician Assistant

## 2018-07-28 ENCOUNTER — Other Ambulatory Visit: Payer: Self-pay

## 2018-07-28 ENCOUNTER — Ambulatory Visit: Payer: 59 | Admitting: Physician Assistant

## 2018-07-28 VITALS — BP 102/68 | HR 108 | Temp 98.8°F | Resp 14 | Ht 67.0 in | Wt 124.0 lb

## 2018-07-28 DIAGNOSIS — B9689 Other specified bacterial agents as the cause of diseases classified elsewhere: Secondary | ICD-10-CM | POA: Diagnosis not present

## 2018-07-28 DIAGNOSIS — J208 Acute bronchitis due to other specified organisms: Secondary | ICD-10-CM | POA: Diagnosis not present

## 2018-07-28 DIAGNOSIS — J4531 Mild persistent asthma with (acute) exacerbation: Secondary | ICD-10-CM | POA: Diagnosis not present

## 2018-07-28 MED ORDER — PREDNISONE 20 MG PO TABS: 40.0000 mg | ORAL_TABLET | Freq: Every day | ORAL | 0 refills | Status: DC

## 2018-07-28 MED ORDER — BENZONATATE 100 MG PO CAPS: 100.0000 mg | ORAL_CAPSULE | Freq: Two times a day (BID) | ORAL | 0 refills | Status: DC | PRN

## 2018-07-28 MED ORDER — DOXYCYCLINE HYCLATE 100 MG PO CAPS: 100.0000 mg | ORAL_CAPSULE | Freq: Two times a day (BID) | ORAL | 0 refills | Status: DC

## 2018-07-28 NOTE — Progress Notes (Signed)
Patient presents to clinic today c/o > 1 week of chest congestion with cough that is productive, headache, fatigue, low-grade fever, nasal congestion. Notes some wheezing and chest tightness. Denies recent travel or sick contact.  Past Medical History:  Diagnosis Date  . Appendicitis   . Asthma   . Periodontitis     Current Outpatient Medications on File Prior to Visit  Medication Sig Dispense Refill  . beclomethasone (QVAR) 80 MCG/ACT inhaler Inhale 1 puff into the lungs 2 (two) times daily. 10.6 g 0  . clonazePAM (KLONOPIN) 0.5 MG tablet Take 1 tablet (0.5 mg total) by mouth daily as needed. 20 tablet 1  . dicyclomine (BENTYL) 20 MG tablet Take 1 tablet (20 mg total) by mouth 2 (two) times daily. 20 tablet 0  . fluticasone (FLONASE) 50 MCG/ACT nasal spray Place 2 sprays into both nostrils daily.    . ondansetron (ZOFRAN) 4 MG tablet Take 1 tablet (4 mg total) by mouth every 6 (six) hours. 12 tablet 0  . buPROPion (WELLBUTRIN XL) 150 MG 24 hr tablet TAKE 1 TABLET BY MOUTH EVERY DAY (Patient not taking: Reported on 07/28/2018) 30 tablet 2   No current facility-administered medications on file prior to visit.     Allergies  Allergen Reactions  . Contrast Media [Iodinated Diagnostic Agents] Hives and Itching    Itching , hives on 04-23-15 CT exam 05-10-15---pt got 1 hr emergent prep in ED for CT -dosed with Barium--no reaction noted  . Penicillins     Childhood allergy Has patient had a PCN reaction causing immediate rash, facial/tongue/throat swelling, SOB or lightheadedness with hypotension: unknown Has patient had a PCN reaction causing severe rash involving mucus membranes or skin necrosis: unknown Has patient had a PCN reaction that required hospitalization unknown Has patient had a PCN reaction occurring within the last 10 years: unknown If all of the above answers are "NO", then may proceed with Cephalosporin use.     History reviewed. No pertinent family history.  Social  History   Socioeconomic History  . Marital status: Married    Spouse name: Not on file  . Number of children: 2  . Years of education: Not on file  . Highest education level: Not on file  Occupational History  . Occupation: Agricultural consultant  Social Needs  . Financial resource strain: Not on file  . Food insecurity:    Worry: Not on file    Inability: Not on file  . Transportation needs:    Medical: Not on file    Non-medical: Not on file  Tobacco Use  . Smoking status: Current Every Day Smoker    Packs/day: 0.50    Years: 10.00    Pack years: 5.00    Types: Cigarettes  . Smokeless tobacco: Never Used  Substance and Sexual Activity  . Alcohol use: No  . Drug use: Yes    Frequency: 3.0 times per week    Types: Marijuana  . Sexual activity: Yes  Lifestyle  . Physical activity:    Days per week: Not on file    Minutes per session: Not on file  . Stress: Not on file  Relationships  . Social connections:    Talks on phone: Not on file    Gets together: Not on file    Attends religious service: Not on file    Active member of club or organization: Not on file    Attends meetings of clubs or organizations: Not on file  Relationship status: Not on file  Other Topics Concern  . Not on file  Social History Narrative  . Not on file   Review of Systems - See HPI.  All other ROS are negative.  BP 102/68   Pulse (!) 108   Temp 98.8 F (37.1 C) (Oral)   Resp 14   Ht 5' 7"  (1.702 m)   Wt 124 lb (56.2 kg)   SpO2 98%   BMI 19.42 kg/m   Physical Exam Vitals signs reviewed.  Constitutional:      Appearance: Normal appearance.  HENT:     Head: Normocephalic and atraumatic.     Right Ear: Tympanic membrane normal.     Left Ear: Tympanic membrane normal.     Nose: Congestion present.     Mouth/Throat:     Mouth: Mucous membranes are moist.  Eyes:     Conjunctiva/sclera: Conjunctivae normal.  Neck:     Musculoskeletal: Neck supple.  Cardiovascular:     Rate and  Rhythm: Normal rate and regular rhythm.     Pulses: Normal pulses.     Heart sounds: Normal heart sounds.  Pulmonary:     Breath sounds: Wheezing present.  Neurological:     General: No focal deficit present.     Mental Status: He is alert and oriented to person, place, and time.  Psychiatric:        Mood and Affect: Mood normal.     Recent Results (from the past 2160 hour(s))  Comprehensive metabolic panel     Status: Abnormal   Collection Time: 05/02/18  1:18 PM  Result Value Ref Range   Sodium 138 135 - 145 mmol/L   Potassium 3.4 (L) 3.5 - 5.1 mmol/L   Chloride 94 (L) 98 - 111 mmol/L   CO2 29 22 - 32 mmol/L   Glucose, Bld 107 (H) 70 - 99 mg/dL   BUN 12 6 - 20 mg/dL   Creatinine, Ser 1.09 0.61 - 1.24 mg/dL   Calcium 9.0 8.9 - 10.3 mg/dL   Total Protein 7.3 6.5 - 8.1 g/dL   Albumin 4.4 3.5 - 5.0 g/dL   AST 22 15 - 41 U/L   ALT 21 0 - 44 U/L   Alkaline Phosphatase 58 38 - 126 U/L   Total Bilirubin 0.9 0.3 - 1.2 mg/dL   GFR calc non Af Amer >60 >60 mL/min   GFR calc Af Amer >60 >60 mL/min   Anion gap 15 5 - 15    Comment: Performed at [facility], [address], [place], [place] [address]  Lipase, blood     Status: None   Collection Time: 05/02/18  1:18 PM  Result Value Ref Range   Lipase 30 11 - 51 U/L    Comment: Performed at [facility], [place] [address] [place], [place] [address]  CBC with Differential     Status: Abnormal   Collection Time: 05/02/18  1:18 PM  Result Value Ref Range   WBC 13.2 (H) 4.0 - 10.5 K/uL   RBC 4.38 4.22 - 5.81 MIL/uL   Hemoglobin 14.5 13.0 - 17.0 g/dL   HCT 41.9 39.0 - 52.0 %   MCV 95.7 80.0 - 100.0 fL   MCH 33.1 26.0 - 34.0 pg   MCHC 34.6 30.0 - 36.0 g/dL   RDW 12.4 11.5 - 15.5 %   Platelets 187 150 - 400 K/uL   nRBC 0.0 0.0 - 0.2 %   Neutrophils Relative %  77 %   Neutro Abs 10.1 (H) 1.7 - 7.7 K/uL   Lymphocytes Relative 14 %   Lymphs Abs 1.8 0.7 - 4.0 K/uL   Monocytes Relative 9 %   Monocytes Absolute 1.2 (H) 0.1 -  1.0 K/uL   Eosinophils Relative 0 %   Eosinophils Absolute 0.0 0.0 - 0.5 K/uL   Basophils Relative 0 %   Basophils Absolute 0.0 0.0 - 0.1 K/uL   Immature Granulocytes 0 %   Abs Immature Granulocytes 0.04 0.00 - 0.07 K/uL    Comment: Performed at [facility] [address] [place], [place] [address]  Urinalysis, Routine w reflex microscopic     Status: Abnormal   Collection Time: 05/02/18  4:19 PM  Result Value Ref Range   Color, Urine YELLOW YELLOW   APPearance CLEAR CLEAR   Specific Gravity, Urine 1.027 1.005 - 1.030   pH 7.0 5.0 - 8.0   Glucose, UA NEGATIVE NEGATIVE mg/dL   Hgb urine dipstick NEGATIVE NEGATIVE   Bilirubin Urine NEGATIVE NEGATIVE   Ketones, ur 20 (A) NEGATIVE mg/dL   Protein, ur 30 (A) NEGATIVE mg/dL   Nitrite NEGATIVE NEGATIVE   Leukocytes, UA NEGATIVE NEGATIVE   RBC / HPF 0-5 0 - 5 RBC/hpf   WBC, UA 0-5 0 - 5 WBC/hpf   Bacteria, UA NONE SEEN NONE SEEN   Squamous Epithelial / LPF 0-5 0 - 5   Mucus PRESENT     Comment: Performed at [facility], [place] [address], [place], [place] [address]  CBC with Differential/Platelet     Status: None   Collection Time: 05/07/18  9:40 AM  Result Value Ref Range   WBC 6.4 4.0 - 10.5 K/uL   RBC 4.26 4.22 - 5.81 Mil/uL   Hemoglobin 14.4 13.0 - 17.0 g/dL   HCT 42.1 39.0 - 52.0 %   MCV 98.8 78.0 - 100.0 fl   MCHC 34.2 30.0 - 36.0 g/dL   RDW 12.8 11.5 - 15.5 %   Platelets 167.0 150.0 - 400.0 K/uL   Neutrophils Relative % 57.4 43.0 - 77.0 %   Lymphocytes Relative 30.4 12.0 - 46.0 %   Monocytes Relative 7.7 3.0 - 12.0 %   Eosinophils Relative 3.8 0.0 - 5.0 %   Basophils Relative 0.7 0.0 - 3.0 %   Neutro Abs 3.7 1.4 - 7.7 K/uL   Lymphs Abs 1.9 0.7 - 4.0 K/uL   Monocytes Absolute 0.5 0.1 - 1.0 K/uL   Eosinophils Absolute 0.2 0.0 - 0.7 K/uL   Basophils Absolute 0.0 0.0 - 0.1 K/uL  Comprehensive metabolic panel     Status: Abnormal   Collection Time: 05/07/18  9:40 AM  Result Value Ref Range   Sodium 139 135 -  145 mEq/L   Potassium 4.0 3.5 - 5.1 mEq/L   Chloride 104 96 - 112 mEq/L   CO2 29 19 - 32 mEq/L   Glucose, Bld 101 (H) 70 - 99 mg/dL   BUN 11 6 - 23 mg/dL   Creatinine, Ser 0.81 0.40 - 1.50 mg/dL   Total Bilirubin 0.3 0.2 - 1.2 mg/dL   Alkaline Phosphatase 62 39 - 117 U/L   AST 11 0 - 37 U/L   ALT 14 0 - 53 U/L   Total Protein 6.6 6.0 - 8.3 g/dL   Albumin 4.4 3.5 - 5.2 g/dL   Calcium 9.2 8.4 - 10.5 mg/dL   GFR 113.83 >60.00 mL/min  Lipid panel     Status: Abnormal  Collection Time: 05/07/18  9:40 AM  Result Value Ref Range   Cholesterol 151 0 - 200 mg/dL    Comment: ATP III Classification       Desirable:  < 200 mg/dL               Borderline High:  200 - 239 mg/dL          High:  > = 240 mg/dL   Triglycerides 84.0 0.0 - 149.0 mg/dL    Comment: Normal:  <150 mg/dLBorderline High:  150 - 199 mg/dL   HDL 38.40 (L) >39.00 mg/dL   VLDL 16.8 0.0 - 40.0 mg/dL   LDL Cholesterol 95 0 - 99 mg/dL   Total CHOL/HDL Ratio 4     Comment:                Men          Women1/2 Average Risk     3.4          3.3Average Risk          5.0          4.42X Average Risk          9.6          7.13X Average Risk          15.0          11.0                       NonHDL 112.26     Comment: NOTE:  Non-HDL goal should be 30 mg/dL higher than patient's LDL goal (i.e. LDL goal of < 70 mg/dL, would have non-HDL goal of < 100 mg/dL)  Hemoglobin A1c     Status: None   Collection Time: 05/07/18  9:40 AM  Result Value Ref Range   Hgb A1c MFr Bld 5.5 4.6 - 6.5 %    Comment: Glycemic Control Guidelines for People with Diabetes:Non Diabetic:  <6%Goal of Therapy: <7%Additional Action Suggested:  >8%   POC Influenza A&B(BINAX/QUICKVUE)     Status: Normal   Collection Time: 06/02/18  2:11 PM  Result Value Ref Range   Influenza A, POC Negative Negative   Influenza B, POC Negative Negative    Assessment/Plan: 1. Acute bacterial bronchitis Rx Doxycycline.  Increase fluids.  Rest.  Saline nasal spray.  Probiotic.   Mucinex as directed.  Humidifier in bedroom. Tessalon per orders.  Call or return to clinic if symptoms are not improving. - doxycycline (VIBRAMYCIN) 100 MG capsule; Take 1 capsule (100 mg total) by mouth 2 (two) times daily.  Dispense: 14 capsule; Refill: 0 - benzonatate (TESSALON) 100 MG capsule; Take 1 capsule (100 mg total) by mouth 2 (two) times daily as needed for cough.  Dispense: 20 capsule; Refill: 0 - predniSONE (DELTASONE) 20 MG tablet; Take 2 tablets (40 mg total) by mouth daily with breakfast.  Dispense: 10 tablet; Refill: 0  2. Mild persistent asthma with exacerbation Encouraged smoking cessation again. Start Prednisone 40 mg for 5 day burst. - predniSONE (DELTASONE) 20 MG tablet; Take 2 tablets (40 mg total) by mouth daily with breakfast.  Dispense: 10 tablet; Refill: 0   [name], PA-C

## 2018-07-28 NOTE — Patient Instructions (Signed)
Take antibiotic (Doxycycline) as directed.  Increase fluids.  Get plenty of rest. Use Mucinex for congestion. Start the Tessalon per cough and Prednisone for wheeze. Take a daily probiotic (I recommend Align or Culturelle, but even Activia Yogurt may be beneficial).  A humidifier placed in the bedroom may offer some relief for a dry, scratchy throat of nasal irritation.  Read information below on acute bronchitis. Please call or return to clinic if symptoms are not improving.  Acute Bronchitis Bronchitis is when the airways that extend from the windpipe into the lungs get red, puffy, and painful (inflamed). Bronchitis often causes thick spit (mucus) to develop. This leads to a cough. A cough is the most common symptom of bronchitis. In acute bronchitis, the condition usually begins suddenly and goes away over time (usually in 2 weeks). Smoking, allergies, and asthma can make bronchitis worse. Repeated episodes of bronchitis may cause more lung problems.  HOME CARE  Rest.  Drink enough fluids to keep your pee (urine) clear or pale yellow (unless you need to limit fluids as told by your doctor).  Only take over-the-counter or prescription medicines as told by your doctor.  Avoid smoking and secondhand smoke. These can make bronchitis worse. If you are a smoker, think about using nicotine gum or skin patches. Quitting smoking will help your lungs heal faster.  Reduce the chance of getting bronchitis again by:  Washing your hands often.  Avoiding people with cold symptoms.  Trying not to touch your hands to your mouth, nose, or eyes.  Follow up with your doctor as told.  GET HELP IF: Your symptoms do not improve after 1 week of treatment. Symptoms include:  Cough.  Fever.  Coughing up thick spit.  Body aches.  Chest congestion.  Chills.  Shortness of breath.  Sore throat.  GET HELP RIGHT AWAY IF:   You have an increased fever.  You have chills.  You have severe  shortness of breath.  You have bloody thick spit (sputum).  You throw up (vomit) often.  You lose too much body fluid (dehydration).  You have a severe headache.  You faint.  MAKE SURE YOU:   Understand these instructions.  Will watch your condition.  Will get help right away if you are not doing well or get worse. Document Released: 10/22/2007 Document Revised: 01/05/2013 Document Reviewed: 10/26/2012 [facility] Patient Information 2015 [place], [place]. This information is not intended to replace advice given to you by your health care provider. Make sure you discuss any questions you have with your health care provider.

## 2018-08-03 ENCOUNTER — Encounter: Payer: Self-pay | Admitting: Physician Assistant

## 2018-09-08 ENCOUNTER — Ambulatory Visit (HOSPITAL_COMMUNITY)
Admission: EM | Admit: 2018-09-08 | Discharge: 2018-09-08 | Disposition: A | Discharge status: Home / Self Care | Payer: 59 | Attending: Family Medicine | Admitting: Family Medicine

## 2018-09-08 ENCOUNTER — Encounter (HOSPITAL_COMMUNITY): Payer: Self-pay | Admitting: Emergency Medicine

## 2018-09-08 ENCOUNTER — Other Ambulatory Visit: Payer: Self-pay

## 2018-09-08 DIAGNOSIS — R1084 Generalized abdominal pain: Secondary | ICD-10-CM | POA: Diagnosis not present

## 2018-09-08 DIAGNOSIS — E876 Hypokalemia: Secondary | ICD-10-CM

## 2018-09-08 DIAGNOSIS — R112 Nausea with vomiting, unspecified: Secondary | ICD-10-CM

## 2018-09-08 DIAGNOSIS — E871 Hypo-osmolality and hyponatremia: Secondary | ICD-10-CM | POA: Diagnosis present

## 2018-09-08 HISTORY — DX: Crohn's disease, unspecified, without complications: K50.90

## 2018-09-08 LAB — BASIC METABOLIC PANEL
Anion gap: 19 — ABNORMAL HIGH (ref 5–15)
BUN: 22 mg/dL — ABNORMAL HIGH (ref 6–20)
CO2: 29 mmol/L (ref 22–32)
Calcium: 9.5 mg/dL (ref 8.9–10.3)
Chloride: 84 mmol/L — ABNORMAL LOW (ref 98–111)
Creatinine, Ser: 1.3 mg/dL — ABNORMAL HIGH (ref 0.61–1.24)
GFR calc Af Amer: >60 mL/min (ref >60)
GFR calc non Af Amer: >60 mL/min (ref >60)
Glucose, Bld: 118 mg/dL — ABNORMAL HIGH (ref 70–99)
Potassium: 2.8 mmol/L — ABNORMAL LOW (ref 3.5–5.1)
Sodium: 132 mmol/L — ABNORMAL LOW (ref 135–145)

## 2018-09-08 MED ORDER — ONDANSETRON HCL 4 MG/2ML IJ SOLN
INTRAMUSCULAR | Status: AC
  Filled 2018-09-08: qty 4

## 2018-09-08 MED ORDER — MORPHINE SULFATE (PF) 4 MG/ML IV SOLN
INTRAVENOUS | Status: AC
  Filled 2018-09-08: qty 1

## 2018-09-08 MED ORDER — ONDANSETRON 4 MG PO TBDP: 4.0000 mg | ORAL_TABLET | Freq: Three times a day (TID) | ORAL | 0 refills | Status: DC | PRN

## 2018-09-08 MED ORDER — MORPHINE SULFATE (PF) 2 MG/ML IV SOLN
4.0000 mg | Freq: Once | INTRAVENOUS | Status: AC
  Administered 2018-09-08: 11:00:00 4 mg via INTRAVENOUS

## 2018-09-08 MED ORDER — SODIUM CHLORIDE 0.9 % IV BOLUS
1000.0000 mL | Freq: Once | INTRAVENOUS | Status: AC
  Administered 2018-09-08: 1000 mL via INTRAVENOUS

## 2018-09-08 MED ORDER — ONDANSETRON HCL 4 MG/2ML IJ SOLN
8.0000 mg | Freq: Once | INTRAMUSCULAR | Status: AC
  Administered 2018-09-08: 8 mg via INTRAVENOUS

## 2018-09-08 MED ORDER — POTASSIUM CHLORIDE CRYS ER 10 MEQ PO TBCR: 10.0000 meq | EXTENDED_RELEASE_TABLET | Freq: Two times a day (BID) | ORAL | 0 refills | Status: DC

## 2018-09-08 NOTE — Discharge Instructions (Addendum)
Please do your best to ensure adequate fluid intake in order to avoid dehydration. If you find that you are unable to tolerate drinking fluids regularly or if your abdominal pain worsens, please proceed to the Emergency Department for evaluation.

## 2018-09-08 NOTE — ED Provider Notes (Signed)
[name]   [id number] 09/08/18 Arrival Time: 6546  ASSESSMENT & PLAN:  1. Intractable nausea and vomiting   2. Generalized abdominal pain   3. Hypokalemia   4. Hyponatremia    Feeling better after: Meds ordered this encounter  Medications  . sodium chloride 0.9 % bolus 1,000 mL  . ondansetron (ZOFRAN) injection 8 mg  . morphine 2 MG/ML injection 4 mg   Prescribed: Meds ordered this encounter  Medications  . potassium chloride (K-DUR) 10 MEQ tablet    Sig: Take 1 tablet (10 mEq total) by mouth 2 (two) times daily.    Dispense:  10 tablet    Refill:  0  . ondansetron (ZOFRAN-ODT) 4 MG disintegrating tablet    Sig: Take 1 tablet (4 mg total) by mouth every 8 (eight) hours as needed for nausea or vomiting.    Dispense:  15 tablet    Refill:  0   Benign abdominal exam. No indications for urgent abdominal/pelvic imaging at this time. Discussed.  Follow-up Information    Schedule an appointment as soon as possible for a visit  with Brunetta Jeans, PA-C.   Specialty:  Family Medicine Why:  Recommend rechecking your sodium and potassium levels in 48 hours. Contact information: [address] [place] [address] [phone]        [place].   Specialty:  Emergency Medicine Why:  Should your symptoms not improve over the next 24 hours or worsen. Contact information: [address] [id number] [place] [phone]          Discharge Instructions     Please do your best to ensure adequate fluid intake in order to avoid dehydration. If you find that you are unable to tolerate drinking fluids regularly or if your abdominal pain worsens, please proceed to the Emergency Department for evaluation.  Reviewed expectations re: course of current medical issues. Questions answered. Outlined signs and symptoms indicating need for more acute intervention. Patient verbalized  understanding. After Visit Summary given.   SUBJECTIVE: History from: patient. Paul Arnold is a 38 y.o. male who presents with complaint of fairly persistent generalized abdominal discomfort with intractable non-bilious, non-bloody n/v. Onset gradual, over the past 48 hours. No diarrhea. Discomfort described as cramping; without radiation. Symptoms are gradually worsening since beginning. Reports these are the symptoms he experiences "with my IBS flare-ups". Last significant exacerbation approx 3-4 months ago. Fever: absent. Aggravating factors: have not been identified. Alleviating factors: have not been identified. "Usually I feel better if I can get fluids." He denies arthralgias, belching, dysuria, headache, myalgias and sweats. Appetite: decreased. PO intake: decreased. Ambulatory without assistance. Urinary symptoms: none. Bowel movements: are less frequent than usual; last bowel movement 2 days ago and without blood. OTC treatment: none reported.  Past Surgical History:  Procedure Laterality Date  . APPENDECTOMY  04/23/2015   laproscopic   . LAPAROSCOPIC APPENDECTOMY N/A 04/23/2015   Procedure: APPENDECTOMY LAPAROSCOPIC;  Surgeon: Judeth Horn, MD;  Location: [facility];  Service: General;  Laterality: N/A;   ROS: As per HPI. All other systems negative.  OBJECTIVE:  Vitals:   09/08/18 1037  BP: (!) 141/77  Pulse: 89  Resp: 20  Temp: 98.3 F (36.8 C)  TempSrc: Oral  SpO2: 97%    General appearance: alert, oriented, appears uncomfortable; supine on exam table Oropharynx: dry Lungs: clear to auscultation bilaterally; unlabored respirations Heart: regular rate and rhythm Abdomen: soft; without distention;  mild and generalized tenderness; normal bowel sounds; with masses or organomegaly; without guarding or rebound tenderness Back: without CVA tenderness; FROM at waist Extremities: without LE edema; symmetrical; without gross deformities Skin: warm and dry Neurologic: normal  gait but walking hunched over on arrival Psychological: alert and cooperative; normal mood and affect  Labs: Results for orders placed or performed during the hospital encounter of 76/81/15  Basic metabolic panel  Result Value Ref Range   Sodium 132 (L) 135 - 145 mmol/L   Potassium 2.8 (L) 3.5 - 5.1 mmol/L   Chloride 84 (L) 98 - 111 mmol/L   CO2 29 22 - 32 mmol/L   Glucose, Bld 118 (H) 70 - 99 mg/dL   BUN 22 (H) 6 - 20 mg/dL   Creatinine, Ser 1.30 (H) 0.61 - 1.24 mg/dL   Calcium 9.5 8.9 - 10.3 mg/dL   GFR calc non Af Amer >60 >60 mL/min   GFR calc Af Amer >60 >60 mL/min   Anion gap 19 (H) 5 - 15    Allergies  Allergen Reactions  . Contrast Media [Iodinated Diagnostic Agents] Hives and Itching    Itching , hives on 04-23-15 CT exam 05-10-15---pt got 1 hr emergent prep in ED for CT -dosed with Barium--no reaction noted  . Penicillins     Childhood allergy Has patient had a PCN reaction causing immediate rash, facial/tongue/throat swelling, SOB or lightheadedness with hypotension: unknown Has patient had a PCN reaction causing severe rash involving mucus membranes or skin necrosis: unknown Has patient had a PCN reaction that required hospitalization unknown Has patient had a PCN reaction occurring within the last 10 years: unknown If all of the above answers are "NO", then may proceed with Cephalosporin use.                                                Past Medical History:  Diagnosis Date  . Appendicitis   . Asthma   . Crohn disease ([place])   . Periodontitis    Social History   Socioeconomic History  . Marital status: Married    Spouse name: Not on file  . Number of children: 2  . Years of education: Not on file  . Highest education level: Not on file  Occupational History  . Occupation: Agricultural consultant  Social Needs  . Financial resource strain: Not on file  . Food insecurity:    Worry: Not on file    Inability: Not on file  . Transportation needs:    Medical: Not  on file    Non-medical: Not on file  Tobacco Use  . Smoking status: Current Every Day Smoker    Packs/day: 0.50    Years: 10.00    Pack years: 5.00    Types: Cigarettes  . Smokeless tobacco: Never Used  Substance and Sexual Activity  . Alcohol use: No  . Drug use: Yes    Frequency: 3.0 times per week    Types: Marijuana  . Sexual activity: Yes  Lifestyle  . Physical activity:    Days per week: Not on file    Minutes per session: Not on file  . Stress: Not on file  Relationships  . Social connections:    Talks on phone: Not on file    Gets together: Not on file    Attends religious service: Not on file  Active member of club or organization: Not on file    Attends meetings of clubs or organizations: Not on file    Relationship status: Not on file  . Intimate partner violence:    Fear of current or ex partner: Not on file    Emotionally abused: Not on file    Physically abused: Not on file    Forced sexual activity: Not on file  Other Topics Concern  . Not on file  Social History Narrative  . Not on file   Family History  Problem Relation Age of Onset  . Healthy Mother      Vanessa Kick, MD 09/08/18 1208

## 2018-09-08 NOTE — ED Triage Notes (Signed)
Pt here for abd pain that is chronic in nature from crohn's worse x 2 days

## 2018-09-10 ENCOUNTER — Telehealth: Payer: Self-pay | Admitting: Physician Assistant

## 2018-09-10 ENCOUNTER — Emergency Department (HOSPITAL_COMMUNITY): Payer: 59

## 2018-09-10 ENCOUNTER — Emergency Department (HOSPITAL_COMMUNITY)
Admission: EM | Admit: 2018-09-10 | Discharge: 2018-09-10 | Disposition: A | Discharge status: Home / Self Care | Payer: 59 | Attending: Emergency Medicine | Admitting: Emergency Medicine

## 2018-09-10 ENCOUNTER — Other Ambulatory Visit: Payer: Self-pay

## 2018-09-10 DIAGNOSIS — F17210 Nicotine dependence, cigarettes, uncomplicated: Secondary | ICD-10-CM | POA: Insufficient documentation

## 2018-09-10 DIAGNOSIS — R1115 Cyclical vomiting syndrome unrelated to migraine: Secondary | ICD-10-CM

## 2018-09-10 DIAGNOSIS — R1013 Epigastric pain: Secondary | ICD-10-CM

## 2018-09-10 DIAGNOSIS — R1031 Right lower quadrant pain: Secondary | ICD-10-CM | POA: Diagnosis not present

## 2018-09-10 DIAGNOSIS — E876 Hypokalemia: Secondary | ICD-10-CM | POA: Insufficient documentation

## 2018-09-10 DIAGNOSIS — R1011 Right upper quadrant pain: Secondary | ICD-10-CM | POA: Insufficient documentation

## 2018-09-10 DIAGNOSIS — Z79899 Other long term (current) drug therapy: Secondary | ICD-10-CM | POA: Insufficient documentation

## 2018-09-10 DIAGNOSIS — F1290 Cannabis use, unspecified, uncomplicated: Secondary | ICD-10-CM | POA: Diagnosis not present

## 2018-09-10 LAB — CBC WITH DIFFERENTIAL/PLATELET
Abs Immature Granulocytes: 0.02 10*3/uL (ref 0.00–0.07)
Basophils Absolute: 0.1 10*3/uL (ref 0.0–0.1)
Basophils Relative: 1 %
Eosinophils Absolute: 0 10*3/uL (ref 0.0–0.5)
Eosinophils Relative: 0 %
HCT: 38.8 % — ABNORMAL LOW (ref 39.0–52.0)
Hemoglobin: 13.9 g/dL (ref 13.0–17.0)
Immature Granulocytes: 0 %
Lymphocytes Relative: 26 %
Lymphs Abs: 2.6 10*3/uL (ref 0.7–4.0)
MCH: 33.3 pg (ref 26.0–34.0)
MCHC: 35.8 g/dL (ref 30.0–36.0)
MCV: 92.8 fL (ref 80.0–100.0)
Monocytes Absolute: 0.8 10*3/uL (ref 0.1–1.0)
Monocytes Relative: 8 %
Neutro Abs: 6.4 10*3/uL (ref 1.7–7.7)
Neutrophils Relative %: 65 %
Platelets: 183 10*3/uL (ref 150–400)
RBC: 4.18 MIL/uL — ABNORMAL LOW (ref 4.22–5.81)
RDW: 11.3 % — ABNORMAL LOW (ref 11.5–15.5)
WBC: 9.9 10*3/uL (ref 4.0–10.5)
nRBC: 0 % (ref 0.0–0.2)

## 2018-09-10 LAB — URINALYSIS, ROUTINE W REFLEX MICROSCOPIC
Bilirubin Urine: NEGATIVE
Glucose, UA: NEGATIVE mg/dL
Hgb urine dipstick: NEGATIVE
Ketones, ur: 20 mg/dL — AB
Leukocytes,Ua: NEGATIVE
Nitrite: NEGATIVE
Protein, ur: NEGATIVE mg/dL
Specific Gravity, Urine: 1.008 (ref 1.005–1.030)
pH: 8 (ref 5.0–8.0)

## 2018-09-10 LAB — RAPID URINE DRUG SCREEN, HOSP PERFORMED
Amphetamines: NOT DETECTED
Barbiturates: NOT DETECTED
Benzodiazepines: NOT DETECTED
Cocaine: NOT DETECTED
Opiates: NOT DETECTED
Tetrahydrocannabinol: POSITIVE — AB

## 2018-09-10 LAB — COMPREHENSIVE METABOLIC PANEL
ALT: 23 U/L (ref 0–44)
AST: 20 U/L (ref 15–41)
Albumin: 4.2 g/dL (ref 3.5–5.0)
Alkaline Phosphatase: 48 U/L (ref 38–126)
Anion gap: 13 (ref 5–15)
BUN: 12 mg/dL (ref 6–20)
CO2: 26 mmol/L (ref 22–32)
Calcium: 8.7 mg/dL — ABNORMAL LOW (ref 8.9–10.3)
Chloride: 94 mmol/L — ABNORMAL LOW (ref 98–111)
Creatinine, Ser: 1.03 mg/dL (ref 0.61–1.24)
GFR calc Af Amer: >60 mL/min (ref >60)
GFR calc non Af Amer: >60 mL/min (ref >60)
Glucose, Bld: 96 mg/dL (ref 70–99)
Potassium: 3 mmol/L — ABNORMAL LOW (ref 3.5–5.1)
Sodium: 133 mmol/L — ABNORMAL LOW (ref 135–145)
Total Bilirubin: 1.1 mg/dL (ref 0.3–1.2)
Total Protein: 6.9 g/dL (ref 6.5–8.1)

## 2018-09-10 LAB — LIPASE, BLOOD: Lipase: 25 U/L (ref 11–51)

## 2018-09-10 MED ORDER — POTASSIUM CHLORIDE CRYS ER 20 MEQ PO TBCR
40.0000 meq | EXTENDED_RELEASE_TABLET | Freq: Once | ORAL | Status: AC
  Administered 2018-09-10: 21:00:00 40 meq via ORAL
  Filled 2018-09-10: qty 2

## 2018-09-10 MED ORDER — HYDROCORTISONE NA SUCCINATE PF 250 MG IJ SOLR
200.0000 mg | Freq: Once | INTRAMUSCULAR | Status: AC
  Administered 2018-09-10: 200 mg via INTRAVENOUS
  Filled 2018-09-10: qty 200

## 2018-09-10 MED ORDER — IOHEXOL 300 MG/ML  SOLN
100.0000 mL | Freq: Once | INTRAMUSCULAR | Status: AC | PRN
  Administered 2018-09-10: 100 mL via INTRAVENOUS

## 2018-09-10 MED ORDER — FENTANYL CITRATE (PF) 100 MCG/2ML IJ SOLN
50.0000 ug | Freq: Once | INTRAMUSCULAR | Status: AC
  Administered 2018-09-10: 50 ug via INTRAVENOUS
  Filled 2018-09-10: qty 2

## 2018-09-10 MED ORDER — DIPHENHYDRAMINE HCL 25 MG PO CAPS: 50.0000 mg | ORAL_CAPSULE | Freq: Once | ORAL | Status: DC

## 2018-09-10 MED ORDER — DIPHENHYDRAMINE HCL 50 MG/ML IJ SOLN
50.0000 mg | Freq: Once | INTRAMUSCULAR | Status: AC
  Administered 2018-09-10: 19:00:00 50 mg via INTRAVENOUS
  Filled 2018-09-10: qty 1

## 2018-09-10 MED ORDER — ONDANSETRON HCL 4 MG/2ML IJ SOLN
4.0000 mg | Freq: Once | INTRAMUSCULAR | Status: AC
  Administered 2018-09-10: 16:00:00 4 mg via INTRAVENOUS
  Filled 2018-09-10: qty 2

## 2018-09-10 NOTE — ED Provider Notes (Addendum)
Paul Arnold Provider Note   CSN: [id number] Arrival date & time: 09/10/18  1511    History   Chief Complaint Chief Complaint  Patient presents with  . Abdominal Pain    HPI Paul Arnold is a 38 y.o. male.     HPI 38 year old man with past medical history of appendectomy presents to the emergency [facility] for 2 days of ongoing right upper and right lower quadrant abdominal pain with profuse nausea and vomiting.  No blood in the vomit.  No diarrhea.  Still passing flatulence from below.  Denies any pain in his penis or testicles.  Denies any lower abdominal pain of the left side or left upper quadrant pain.  Denies any other surgical history.  No recent trauma to the abdomen.  Pain does not radiate.  Sitting forward makes the pain worse and laying still makes the pain better.  Was diagnosed with hypokalemia and given potassium tablets as well as Zofran 2 days ago when he was initially seen in another emergency [facility].  Pain had relieved at that time but then continued to worsen over the last 2 days.  Denies any fevers.  No recent infections otherwise.  No sick contacts that he is aware of. Past Medical History:  Diagnosis Date  . Appendicitis   . Asthma   . Crohn disease ([place])   . Periodontitis     Patient Active Problem List   Diagnosis Date Noted  . GAD (generalized anxiety disorder) 01/06/2018  . Acute appendicitis 04/23/2015    Past Surgical History:  Procedure Laterality Date  . APPENDECTOMY  04/23/2015   laproscopic   . LAPAROSCOPIC APPENDECTOMY N/A 04/23/2015   Procedure: APPENDECTOMY LAPAROSCOPIC;  Surgeon: Judeth Horn, MD;  Location: [facility];  Service: General;  Laterality: N/A;        Home Medications    Prior to Admission medications   Medication Sig Start Date End Date Taking? Authorizing Provider  beclomethasone (QVAR) 80 MCG/ACT inhaler Inhale 1 puff into the lungs 2 (two) times daily. 02/15/18   Brunetta Jeans, PA-C  benzonatate (TESSALON) 100 MG capsule Take 1 capsule (100 mg total) by mouth 2 (two) times daily as needed for cough. 07/28/18   Brunetta Jeans, PA-C  buPROPion (WELLBUTRIN XL) 150 MG 24 hr tablet TAKE 1 TABLET BY MOUTH EVERY DAY Patient not taking: Reported on 07/28/2018 06/25/18   Brunetta Jeans, PA-C  clonazePAM (KLONOPIN) 0.5 MG tablet Take 1 tablet (0.5 mg total) by mouth daily as needed. 06/28/18   Brunetta Jeans, PA-C  dicyclomine (BENTYL) 20 MG tablet Take 1 tablet (20 mg total) by mouth 2 (two) times daily. 05/02/18   Maczis, Barth Kirks, PA-C  doxycycline (VIBRAMYCIN) 100 MG capsule Take 1 capsule (100 mg total) by mouth 2 (two) times daily. 07/28/18   Brunetta Jeans, PA-C  fluticasone (FLONASE) 50 MCG/ACT nasal spray Place 2 sprays into both nostrils daily.    [provider]  ondansetron (ZOFRAN) 4 MG tablet Take 1 tablet (4 mg total) by mouth every 6 (six) hours. 05/02/18   Maczis, Barth Kirks, PA-C  ondansetron (ZOFRAN-ODT) 4 MG disintegrating tablet Take 1 tablet (4 mg total) by mouth every 8 (eight) hours as needed for nausea or vomiting. 09/08/18   Vanessa Kick, MD  potassium chloride (K-DUR) 10 MEQ tablet Take 1 tablet (10 mEq total) by mouth 2 (two) times daily. 09/08/18   Vanessa Kick, MD  predniSONE (DELTASONE) 20 MG tablet Take  2 tablets (40 mg total) by mouth daily with breakfast. 07/28/18   Brunetta Jeans, PA-C    Family History Family History  Problem Relation Age of Onset  . Healthy Mother     Social History Social History   Tobacco Use  . Smoking status: Current Every Day Smoker    Packs/day: 0.50    Years: 10.00    Pack years: 5.00    Types: Cigarettes  . Smokeless tobacco: Never Used  Substance Use Topics  . Alcohol use: No  . Drug use: Yes    Frequency: 3.0 times per week    Types: Marijuana     Allergies   Contrast media [iodinated diagnostic agents] and Penicillins   Review of Systems Review of Systems   Constitutional: Positive for activity change, appetite change and fatigue. Negative for chills and fever.  HENT: Negative for congestion, ear pain and sore throat.   Eyes: Negative for pain and visual disturbance.  Respiratory: Negative for cough and shortness of breath.   Cardiovascular: Negative for chest pain and palpitations.  Gastrointestinal: Positive for abdominal pain, nausea and vomiting. Negative for abdominal distention, blood in stool, constipation and diarrhea.  Genitourinary: Negative for dysuria and hematuria.  Musculoskeletal: Negative for arthralgias and back pain.  Skin: Negative for color change and rash.  Neurological: Negative for seizures and syncope.  All other systems reviewed and are negative.    Physical Exam Updated Vital Signs BP 120/66   Pulse 75   Temp 99.4 F (37.4 C) (Oral)   Resp 13   Ht 5' 7"  (1.702 m)   Wt 57.6 kg   SpO2 100%   BMI 19.89 kg/m   Physical Exam Vitals signs and nursing note reviewed.  Constitutional:      Appearance: He is well-developed. He is not ill-appearing or diaphoretic.  HENT:     Head: Normocephalic and atraumatic.  Eyes:     Conjunctiva/sclera: Conjunctivae normal.  Neck:     Musculoskeletal: Neck supple.  Cardiovascular:     Rate and Rhythm: Normal rate and regular rhythm.     Heart sounds: No murmur.  Pulmonary:     Effort: Pulmonary effort is normal. No respiratory distress.     Breath sounds: Normal breath sounds.  Abdominal:     Palpations: Abdomen is soft.     Tenderness: There is abdominal tenderness in the right upper quadrant and right lower quadrant. There is guarding. There is no right CVA tenderness, left CVA tenderness or rebound. Positive signs include Murphy's sign and McBurney's sign.  Genitourinary:    Penis: Normal.   Skin:    General: Skin is warm and dry.  Neurological:     Mental Status: He is alert.      ED Treatments / Results  Labs (all labs ordered are listed, but only  abnormal results are displayed) Labs Reviewed  CBC WITH DIFFERENTIAL/PLATELET - Abnormal; Notable for the following components:      Result Value   RBC 4.18 (*)    HCT 38.8 (*)    RDW 11.3 (*)    All other components within normal limits  COMPREHENSIVE METABOLIC PANEL  LIPASE, BLOOD  URINALYSIS, ROUTINE W REFLEX MICROSCOPIC  RAPID URINE DRUG SCREEN, HOSP PERFORMED  URINALYSIS, ROUTINE W REFLEX MICROSCOPIC    EKG None  Radiology Dg Chest Portable 1 View  Result Date: 09/10/2018 CLINICAL DATA:  Right-sided chest pain for 1 week EXAM: PORTABLE CHEST 1 VIEW COMPARISON:  05/10/2015 FINDINGS: The heart size and  mediastinal contours are within normal limits. Both lungs are clear. The visualized skeletal structures are unremarkable. IMPRESSION: No active disease. Electronically Signed   By: Inez Catalina M.D.   On: 09/10/2018 16:13    Procedures Procedures (including critical care time)  Medications Ordered in ED Medications  hydrocortisone sodium succinate (SOLU-CORTEF) injection 200 mg (has no administration in time range)  diphenhydrAMINE (BENADRYL) injection 50 mg (has no administration in time range)  fentaNYL (SUBLIMAZE) injection 50 mcg (50 mcg Intravenous Given 09/10/18 1612)  ondansetron (ZOFRAN) injection 4 mg (4 mg Intravenous Given 09/10/18 1612)     Initial Impression / Assessment and Plan / ED Course  I have reviewed the triage vital signs and the nursing notes.  Pertinent labs & imaging results that were available during my care of the patient were reviewed by me and considered in my medical decision making (see chart for details).         38 year old man presents to the emergency [facility] for acute onset right-sided abdominal pain worsening for the last 2 days.  Although his chart says possible history of Crohn's disease, the patient denies this history of Crohn's disease.  Patient states that he smokes marijuana daily.  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   After several hours in the emergency [facility] receiving Benadryl, Zofran and 50 mcg of fentanyl the patient was able to tolerate p.o., had no vomiting and had resolution of his abdominal pain.  CT scan did not reveal any acute intra-abdominal pathology.  Basic lab work appears to be back at its baseline with a resolved AKI from 2 days ago indicating proper hydration.  Still with some hypokalemia.  Able to tolerate p.o. at this time so he is safe for discharge.  Patient in agreement with the plan. 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

## 2018-09-10 NOTE — ED Notes (Signed)
CT scan notified that pt. received Benadryl IV .

## 2018-09-10 NOTE — ED Triage Notes (Signed)
Patient arrived via EMS. Complaints of RUQ abdominal pain with multiple episodes of nausea and vomiting, fever and cough. Seen by provider 2 days ago and prescribed Tessalon pearls, zofran and potassium. Highest temperature 100.4 at home. Patient abdomen tender, hurled over in pain. Reports no travel, but reports work friend sick with similar symptoms. Reports dyspnea with exertion. Jake Bathe, MEd, RN [facility] 09/10/2018 3:36 PM

## 2018-09-10 NOTE — Telephone Encounter (Signed)
Thank you for making me aware. I will keep an eye on his chart.     Copied from Story [id number]. Topic: General - Inquiry >> Sep 10, 2018  3:10 PM Richardo Priest, [place] wrote: Reason for CRM: Patient's wife called in stating patient was taken in to hospital for a fever and severe stomach pain. Wife wants to update [facility] with this in case any changes may need to be made for further treatment.

## 2018-09-13 ENCOUNTER — Encounter: Payer: Self-pay | Admitting: Physician Assistant

## 2018-09-13 ENCOUNTER — Other Ambulatory Visit: Payer: Self-pay

## 2018-09-13 ENCOUNTER — Ambulatory Visit (INDEPENDENT_AMBULATORY_CARE_PROVIDER_SITE_OTHER): Payer: 59 | Admitting: Physician Assistant

## 2018-09-13 VITALS — Temp 98.5°F

## 2018-09-13 DIAGNOSIS — E876 Hypokalemia: Secondary | ICD-10-CM | POA: Diagnosis not present

## 2018-09-13 DIAGNOSIS — R1115 Cyclical vomiting syndrome unrelated to migraine: Secondary | ICD-10-CM

## 2018-09-13 DIAGNOSIS — K2900 Acute gastritis without bleeding: Secondary | ICD-10-CM | POA: Diagnosis not present

## 2018-09-13 MED ORDER — SUCRALFATE 1 G PO TABS: 1.0000 g | ORAL_TABLET | Freq: Three times a day (TID) | ORAL | 0 refills | Status: DC

## 2018-09-13 MED ORDER — ONDANSETRON 4 MG PO TBDP: 4.0000 mg | ORAL_TABLET | Freq: Three times a day (TID) | ORAL | 0 refills | Status: DC | PRN

## 2018-09-13 NOTE — Progress Notes (Signed)
Virtual Visit via Video   I connected with patient on 09/13/18 at  2:00 PM EDT by a video enabled telemedicine application and verified that I am speaking with the correct person using two identifiers.  Location patient: Home Location provider: [facility], Office Persons participating in the virtual visit: Patient, Provider, [place] (Patina Moore)  I discussed the limitations of evaluation and management by telemedicine and the availability of in person appointments. The patient expressed understanding and agreed to proceed.  Subjective:   HPI:   Patient presents via [facility] for ER follow-up of abdominal pain and vomiting felt to be 2/2 THC-associated cyclical vomiting. Patient presented to ER initially on 09/08/2018 and again on 09/10/2018 with complaints of 4 days of abdominal pain with nausea and non-bloody emesis. Workup on 09/08/2018 included BMP showing mild AKI with Cr at 1.30 felt 2/2 dehydration. Was given Morphine and Zofran, IV fluids and sent home to rest. Returned on 09/10/2018 with ongoing symptoms. At this time further labs obtained (CBC stable, CMP revealing Cr improved back to normal and potassium improved from 2.8 to 3.0, unremarkable UA, Urine drug screen + THC), CXR (unremarkable) and CT Abdomen/Pelvis (negative). Was given IV fluids, Fentanyl, Diphenhydramine and Potassium repletion. Discharged hom to avoid [facility] use (felt this was cause of symptoms) and follow-up with PCP.  Since discharge, patient endorses no recurrence severe abdominal pain. Occasional has mild epigastric tenderness with certain foods. Denies heartburn, cough, or globus sensation. Notes some lingering, intermittent nausea. Is sticking to mainly water and soups. Tried to eat pizza last night but made him feel sick. Notes bowels are regular without melena, hematochezia or tenesmus. Has cut out energy drinks (was drinking 5+ per day) and THC. Also notes he did take several ibuprofen and goody powders the day  before symptom onset. Has not taken any since.   ROS:   See pertinent positives and negatives per HPI.  Patient Active Problem List   Diagnosis Date Noted   GAD (generalized anxiety disorder) 01/06/2018   Acute appendicitis 04/23/2015    Social History   Tobacco Use   Smoking status: Former Smoker    Packs/day: 0.50    Years: 10.00    Pack years: 5.00    Types: Cigarettes   Smokeless tobacco: Never Used  Substance Use Topics   Alcohol use: No    Current Outpatient Medications:    beclomethasone (QVAR) 80 MCG/ACT inhaler, Inhale 1 puff into the lungs 2 (two) times daily. (Patient taking differently: Inhale 1 puff into the lungs 2 (two) times daily as needed (for flares). ), Disp: 10.6 g, Rfl: 0   clonazePAM (KLONOPIN) 0.5 MG tablet, Take 1 tablet (0.5 mg total) by mouth daily as needed. (Patient taking differently: Take 0.5 mg by mouth daily as needed for anxiety. ), Disp: 20 tablet, Rfl: 1   fluticasone (FLONASE) 50 MCG/ACT nasal spray, Place 2 sprays into both nostrils daily as needed for rhinitis (or seasonal allergies). , Disp: , Rfl:    potassium chloride (K-DUR) 10 MEQ tablet, Take 1 tablet (10 mEq total) by mouth 2 (two) times daily., Disp: 10 tablet, Rfl: 0   bismuth subsalicylate (PEPTO BISMOL) 262 MG/15ML suspension, Take 30 mLs by mouth every 6 (six) hours as needed for indigestion., Disp: , Rfl:    ondansetron (ZOFRAN-ODT) 4 MG disintegrating tablet, Take 1 tablet (4 mg total) by mouth every 8 (eight) hours as needed for nausea or vomiting., Disp: 20 tablet, Rfl: 0   sucralfate (CARAFATE) 1 g tablet,  Take 1 tablet (1 g total) by mouth 4 (four) times daily -  with meals and at bedtime., Disp: 40 tablet, Rfl: 0  Allergies  Allergen Reactions   Contrast Media [Iodinated Diagnostic Agents] Hives and Itching    Itching , hives on 04-23-15 CT exam 05-10-15---pt got 1 hr emergent prep in ED for CT -dosed with Barium--no reaction noted   Penicillins Other (See  Comments)    From childhood- reaction not recalled Has patient had a PCN reaction causing immediate rash, facial/tongue/throat swelling, SOB or lightheadedness with hypotension: Unk Has patient had a PCN reaction causing severe rash involving mucus membranes or skin necrosis: Unk Has patient had a PCN reaction that required hospitalization: Unk Has patient had a PCN reaction occurring within the last 10 years: No If all of the above answers are "NO", then may proceed with Cephalosporin use.     Objective:   Temp 98.5 F (36.9 C) (Temporal)   Patient is well-developed, well-nourished in no acute distress.  Resting comfortably in chair at home.  Head is normocephalic, atraumatic.  No labored breathing.  Speech is clear and coherent with logical contest.  Patient is alert and oriented at baseline.   Assessment and Plan:    1. Other acute gastritis without hemorrhage 2. Cyclical vomiting Suspected 2/2 THC use which definitely can be a contributor but significant NSAID use is also likely culprit. Reviewed proper diet. No THC or NSAIDS. No alcohol currently. Start Carafate to help with lingering gastritis and to promote healing of lining. Zofran as needed. BRAT diet reviewed. Referral to GI placed. Follow-up 1 week.  - sucralfate (CARAFATE) 1 g tablet; Take 1 tablet (1 g total) by mouth 4 (four) times daily -  with meals and at bedtime.  Dispense: 40 tablet; Refill: 0 - ondansetron (ZOFRAN-ODT) 4 MG disintegrating tablet; Take 1 tablet (4 mg total) by mouth every 8 (eight) hours as needed for nausea or vomiting.  Dispense: 20 tablet; Refill: 0  3. Hypokalemia Replenished in ER. No recurrence of vomiting. Discussed proper intake and avoidance of energy drinks.     [name], PA-C 09/13/2018

## 2018-09-13 NOTE — Progress Notes (Signed)
I have discussed the procedure for the virtual visit with the patient who has given consent to proceed with assessment and treatment.   Paul Arnold, CMA     

## 2018-09-14 ENCOUNTER — Encounter: Payer: Self-pay | Admitting: Physician Assistant

## 2018-09-16 ENCOUNTER — Ambulatory Visit (INDEPENDENT_AMBULATORY_CARE_PROVIDER_SITE_OTHER): Payer: 59 | Admitting: Gastroenterology

## 2018-09-16 ENCOUNTER — Other Ambulatory Visit: Payer: Self-pay

## 2018-09-16 ENCOUNTER — Encounter: Payer: Self-pay | Admitting: Gastroenterology

## 2018-09-16 VITALS — Ht 67.0 in | Wt 120.0 lb

## 2018-09-16 DIAGNOSIS — F12988 Cannabis use, unspecified with other cannabis-induced disorder: Secondary | ICD-10-CM | POA: Diagnosis not present

## 2018-09-16 DIAGNOSIS — R112 Nausea with vomiting, unspecified: Secondary | ICD-10-CM

## 2018-09-16 DIAGNOSIS — E876 Hypokalemia: Secondary | ICD-10-CM | POA: Diagnosis not present

## 2018-09-16 DIAGNOSIS — R1116 Cannabis hyperemesis syndrome: Secondary | ICD-10-CM

## 2018-09-16 DIAGNOSIS — Z791 Long term (current) use of non-steroidal anti-inflammatories (NSAID): Secondary | ICD-10-CM

## 2018-09-16 MED ORDER — PANTOPRAZOLE SODIUM 40 MG PO TBEC: 40.0000 mg | DELAYED_RELEASE_TABLET | Freq: Every day | ORAL | 3 refills | Status: DC

## 2018-09-16 MED ORDER — ONDANSETRON 8 MG PO TBDP: 8.0000 mg | ORAL_TABLET | Freq: Three times a day (TID) | ORAL | 0 refills | Status: DC | PRN

## 2018-09-16 NOTE — Patient Instructions (Addendum)
Please continue to abstain from marijuana.  Please have blood work today or tomorrow so that we can monitor your low potassium levels.   I recommend that you start taking pantoprazole 40 mg daily for 8 weeks. Please take it 30-60 minutes prior to breakfast.   We will refill your ondansetron at a higher dose.  Avoid all antiinflammatory medications such as ibuprofen and Goody's. These can be very hard on your GI tract.   Please contact me if you are note feeling better over the next 2-3 weeks, or with any worrisome concerns.  Thank you for your patience with me and our technology today! Please stay home, safe, and healthy. I look forward to meeting you in person in the future.

## 2018-09-16 NOTE — Progress Notes (Signed)
TELEHEALTH VISIT  Referring Provider: Delorse Limber Primary Care Physician:  Brunetta Jeans, PA-C   Tele-visit due to COVID-19 pandemic Patient requested visit virtually, consented to the virtual encounter via video enabled telemedicine application (D[employer]) Contact made at: 10:01 09/16/18 Patient verified by name and date of birth Location of patient: Home Location provider: [facility] Names of persons participating: Me, patient, [name] CMA Time spent on telehealth visit: 24 minutes I discussed the limitations of evaluation and management by telemedicine. The patient expressed understanding and agreed to proceed.  Reason for Consultation:  Abdominal pain and vomiting   IMPRESSION:  Cannabinoid hyperemesis syndrome Urine tox + THC Frequent NSAIDs - ibuprofen and Goody's powder Recent hypokalemia History of Crohns versus IBS in 1998    - did not follow-up or have formal evaluation performed due to financial limitations at the time  Symptoms improving. Reviewed diagnosis of cannabinoid hyperemesis syndrome and management strategies. May have concurrent reflux given ongoing symptoms.  Recent hypokalemia needs follow-up.   No ongoing symptoms to support a diagnosis of Crohn's disease  PLAN: Ondansetron 8 mg SL #50 Add protonix 40 mg daily x 8 weeks for possible GERD overlap Continue to abstain from marijuana Avoid all NSAIDs BMP to follow-up on his hypokalemia. Follow-up in 2-3 weeks if symptoms persist  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  Referred by PA Hassell Done for further evaluation of acute gastritis and cyclical vomiting.   Seen in the ER 09/08/18 for 4 days of  abdominal pain, nausea, and vomiting. He was treated with morphine, Zofran, and IV fluids with improvement. Returned to ED  09/10/18 with ongoing symptoms treated with Fentanyl, diphenhydramine, and potassium repletion.  Follow-up with PCP 09/13/18.  Symptoms have improved with ondansetron. No additional severe abdominal pain. Occasional mild epigastric pain with certain foods.  Returned to work today.   Continues to awake with abdominal pain. Dull pain. Hurts at lunch and again at bedtime.  Pain improves with defecation. Shower with hot water providers relief. Taking at least 4-5 showers daily.  Ongoing mild nausea. Zofran SL provides some relief but now with increased breakthrough symptoms.  Using 4 mg every 4 hours.  Less response to sucralfate. No other associated symptoms. No identified exacerbating or relieving features.   Has been using ibuprofen and goody powders for his symptoms, although he was using them prior to his symptom onset as well.   Labs 09/10/18: Na 133, K 3.0, BUN 12, crt 1.03, alb 4.2, TB 1.1, AST 20, ALT 2, alk phos 48, lipase 25, WBC 9.9, hgb 13.9, plt 183, urine tox positive for THC CT abd/pelvis with contrast 09/10/18: no acute findings. I have personally reviewed the CT scan and see no obvious explanation for his symptoms.  Diagnosed with Crohns in 1998. His insides were "twisted up like a towel." He was told it was IBS or IBD. He could not follow-up with a specialist.   Father had a GI problem related to his time in [place]. Maternal grandfather polyps. No other known family history of colon cancer or polyps. No family history of uterine/endometrial cancer, pancreatic cancer or gastric/stomach cancer.  Past Medical History:  Diagnosis Date  . Appendicitis   . Asthma   . Crohn disease ([place])   . Periodontitis     Past  Surgical History:  Procedure Laterality Date  . APPENDECTOMY  04/23/2015   laproscopic   . LAPAROSCOPIC APPENDECTOMY N/A 04/23/2015   Procedure: APPENDECTOMY LAPAROSCOPIC;  Surgeon: Judeth Horn, MD;  Location: [facility];  Service: General;  Laterality: N/A;    Current  Outpatient Medications  Medication Sig Dispense Refill  . beclomethasone (QVAR) 80 MCG/ACT inhaler Inhale 1 puff into the lungs 2 (two) times daily. (Patient taking differently: Inhale 1 puff into the lungs 2 (two) times daily as needed (for flares). ) 10.6 g 0  . bismuth subsalicylate (PEPTO BISMOL) 262 MG/15ML suspension Take 30 mLs by mouth every 6 (six) hours as needed for indigestion.    . clonazePAM (KLONOPIN) 0.5 MG tablet Take 1 tablet (0.5 mg total) by mouth daily as needed. (Patient taking differently: Take 0.5 mg by mouth daily as needed for anxiety. ) 20 tablet 1  . fluticasone (FLONASE) 50 MCG/ACT nasal spray Place 2 sprays into both nostrils daily as needed for rhinitis (or seasonal allergies).     . ondansetron (ZOFRAN-ODT) 4 MG disintegrating tablet Take 1 tablet (4 mg total) by mouth every 8 (eight) hours as needed for nausea or vomiting. 20 tablet 0  . potassium chloride (K-DUR) 10 MEQ tablet Take 1 tablet (10 mEq total) by mouth 2 (two) times daily. 10 tablet 0  . sucralfate (CARAFATE) 1 g tablet Take 1 tablet (1 g total) by mouth 4 (four) times daily -  with meals and at bedtime. 40 tablet 0   No current facility-administered medications for this visit.     Allergies as of 09/16/2018 - Review Complete 09/16/2018  Allergen Reaction Noted  . Contrast media [iodinated diagnostic agents] Hives and Itching 05/01/2015  . Penicillins Other (See Comments) 04/23/2015    Family History  Problem Relation Age of Onset  . Healthy Mother     Social History   Socioeconomic History  . Marital status: Married    Spouse name: Not on file  . Number of children: 2  . Years of education: Not on file  . Highest education level: Not on file  Occupational History  . Occupation: Agricultural consultant  Social Needs  . Financial resource strain: Not on file  . Food insecurity:    Worry: Not on file    Inability: Not on file  . Transportation needs:    Medical: Not on file    Non-medical: Not  on file  Tobacco Use  . Smoking status: Former Smoker    Packs/day: 0.50    Years: 10.00    Pack years: 5.00    Types: Cigarettes  . Smokeless tobacco: Never Used  Substance and Sexual Activity  . Alcohol use: No  . Drug use: Yes    Types: Marijuana  . Sexual activity: Yes  Lifestyle  . Physical activity:    Days per week: Not on file    Minutes per session: Not on file  . Stress: Not on file  Relationships  . Social connections:    Talks on phone: Not on file    Gets together: Not on file    Attends religious service: Not on file    Active member of club or organization: Not on file    Attends meetings of clubs or organizations: Not on file    Relationship status: Not on file  . Intimate partner violence:    Fear of current or ex partner: Not on file    Emotionally abused: Not on file    Physically abused:  Not on file    Forced sexual activity: Not on file  Other Topics Concern  . Not on file  Social History Narrative  . Not on file    Review of Systems: ALL ROS discussed and all others negative except listed in HPI.  Physical Exam: General: in no acute distress Neuro: Alert and appropriate Psych: Normal affect and normal insight Exam otherwise limited given the telehealth encounter.   Arli Bree L. Tarri Glenn, MD, MPH [facility] 09/16/2018, 9:56 AM

## 2018-09-22 ENCOUNTER — Encounter: Payer: Self-pay | Admitting: Physician Assistant

## 2018-09-22 DIAGNOSIS — F411 Generalized anxiety disorder: Secondary | ICD-10-CM

## 2018-09-22 MED ORDER — CLONAZEPAM 0.5 MG PO TABS: 0.5000 mg | ORAL_TABLET | Freq: Every day | ORAL | 0 refills | Status: DC | PRN

## 2018-09-23 ENCOUNTER — Telehealth: Payer: Self-pay

## 2018-09-23 ENCOUNTER — Other Ambulatory Visit: Payer: Self-pay

## 2018-09-23 MED ORDER — DICYCLOMINE HCL 10 MG PO CAPS: 20.0000 mg | ORAL_CAPSULE | Freq: Four times a day (QID) | ORAL | 3 refills | Status: DC | PRN

## 2018-09-23 NOTE — Telephone Encounter (Signed)
televisit [facility] scheduled 09/27/18 @10 :00am with Dr. Tarri Glenn. Bentyl sent to patient's pharmacy. Patient called and notified of above

## 2018-09-24 ENCOUNTER — Encounter: Payer: Self-pay | Admitting: Physician Assistant

## 2018-09-27 ENCOUNTER — Other Ambulatory Visit: Payer: Self-pay

## 2018-09-27 ENCOUNTER — Ambulatory Visit (INDEPENDENT_AMBULATORY_CARE_PROVIDER_SITE_OTHER): Payer: 59 | Admitting: Gastroenterology

## 2018-09-27 ENCOUNTER — Encounter: Payer: Self-pay | Admitting: Gastroenterology

## 2018-09-27 DIAGNOSIS — F12988 Cannabis use, unspecified with other cannabis-induced disorder: Secondary | ICD-10-CM | POA: Diagnosis not present

## 2018-09-27 DIAGNOSIS — E871 Hypo-osmolality and hyponatremia: Secondary | ICD-10-CM | POA: Diagnosis not present

## 2018-09-27 DIAGNOSIS — F1290 Cannabis use, unspecified, uncomplicated: Secondary | ICD-10-CM

## 2018-09-27 DIAGNOSIS — R112 Nausea with vomiting, unspecified: Secondary | ICD-10-CM | POA: Diagnosis not present

## 2018-09-27 DIAGNOSIS — E876 Hypokalemia: Secondary | ICD-10-CM

## 2018-09-27 MED ORDER — FAMOTIDINE 20 MG PO TABS: 20.0000 mg | ORAL_TABLET | ORAL | 3 refills | Status: DC

## 2018-09-27 MED ORDER — ONDANSETRON 8 MG PO TBDP: 8.0000 mg | ORAL_TABLET | Freq: Three times a day (TID) | ORAL | 0 refills | Status: DC | PRN

## 2018-09-27 NOTE — Progress Notes (Signed)
TELEHEALTH VISIT  Referring Provider: Delorse Limber Primary Care Physician:  Brunetta Jeans, PA-C   Tele-visit due to COVID-19 pandemic Patient requested visit virtually, consented to the virtual encounter via video enabled telemedicine application (Zoom, converted to audio video after difficulties with video component of the encounter) Contact made at: 10:00 09/27/18 Patient verified by name and date of birth Location of patient: Home Location provider: [facility] Names of persons participating: Me, patient, [name] CMA Time spent on telehealth visit: 24 minutes I discussed the limitations of evaluation and management by telemedicine. The patient expressed understanding and agreed to proceed.  Chief complaint:  Abdominal pain and vomiting   IMPRESSION:  Nausea, vomiting, abdominal pain    - CT abd/pelvis with contrast 09/10/18: no acute findings    - normal LFTs and lipase 09/10/18    - suspected Cannabinoid hyperemesis syndrome Urine tox + THC Frequent NSAIDs - ibuprofen and Goody's powder Recent hypokalemia Recent hyponatremia History of Crohns versus IBS in 1998    - did not follow-up or have formal evaluation performed due to financial limitations at the time  Persistent early morning symptoms despite antiemetics and PPI.   Suspected Cannabinoid hyperemesis syndrome, however, some symptoms are present longer than expected as his last use of marijuana was prior to 09/10/18.  Recent hypokalemia and hyponatremia need close follow-up, as these may be contributing to his persistent symptoms despite abstinence from marijuana.   No ongoing symptoms to support a diagnosis of Crohn's disease  PLAN: Ondansetron 8 mg SL #50 Continue protonix 40 mg daily x 8 weeks for possible GERD overlap Start famotidine 20 mg QAM given persistent symptoms  Continue to abstain from marijuana Continue to avoid all NSAIDs BMP to follow-up on his hypokalemia and  hyponatremia EGD when coronavirus restrictions have been lifted Follow-up in 2-3 weeks if symptoms persist  I consented the patient discussing the risks, benefits, and alternatives to endoscopic evaluation. In particular, we discussed the risks that include, but are not limited to, reaction to medication, cardiopulmonary compromise, bleeding requiring blood transfusion, aspiration resulting in pneumonia, perforation requiring surgery, lack of diagnosis, severe illness requiring hospitalization, and even death. We reviewed the risk of missed lesion including polyps or even cancer. The patient acknowledges these risks and asks that we proceed.  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  When we initially met during a telehealth consultation 09/16/18 abdominal pain, nausea, and vomiting. His GI symptoms were improving at the time of consultation. He called last week reporting progressive concerns.   He has been avoiding all NSAIDs. Using Bentyl to help with the pain. Historically, uses marijuana to manage his symptoms. He has not used any marijuana since prior to his ER visit 09/08/18.   Feels sick for the first 3 hours of every day. Awakes with nausea, vomiting, and pain. However, he has been able to control the pain with Bentyl. Only tolerating soup, bread, and water. No appetite. May have lost some weight.  One of his wife's hydrocodone helped temporarily, but pain was worse after it wore off. He has not used any further. Pain improves with defecation. Shower with hot water providers relief. Taking at least 4-5 showers daily.   No new complaints or concerns. No other associated symptoms. No identified exacerbating or relieving features.    Past Medical History:  Diagnosis Date  . Appendicitis   . Asthma   . Crohn disease ([place])   .  Periodontitis     Past Surgical History:  Procedure Laterality Date  . APPENDECTOMY  04/23/2015    laproscopic   . LAPAROSCOPIC APPENDECTOMY N/A 04/23/2015   Procedure: APPENDECTOMY LAPAROSCOPIC;  Surgeon: Judeth Horn, MD;  Location: [facility];  Service: General;  Laterality: N/A;    Current Outpatient Medications  Medication Sig Dispense Refill  . beclomethasone (QVAR) 80 MCG/ACT inhaler Inhale 1 puff into the lungs 2 (two) times daily. (Patient taking differently: Inhale 1 puff into the lungs 2 (two) times daily as needed (for flares). ) 10.6 g 0  . bismuth subsalicylate (PEPTO BISMOL) 262 MG/15ML suspension Take 30 mLs by mouth every 6 (six) hours as needed for indigestion.    . clonazePAM (KLONOPIN) 0.5 MG tablet Take 1 tablet (0.5 mg total) by mouth daily as needed. 20 tablet 0  . dicyclomine (BENTYL) 10 MG capsule Take 2 capsules (20 mg total) by mouth 4 (four) times daily as needed for spasms. 240 capsule 3  . fluticasone (FLONASE) 50 MCG/ACT nasal spray Place 2 sprays into both nostrils daily as needed for rhinitis (or seasonal allergies).     . ondansetron (ZOFRAN ODT) 8 MG disintegrating tablet Take 1 tablet (8 mg total) by mouth every 8 (eight) hours as needed for nausea or vomiting. 50 tablet 0  . pantoprazole (PROTONIX) 40 MG tablet Take 1 tablet (40 mg total) by mouth daily. 30 tablet 3  . potassium chloride (K-DUR) 10 MEQ tablet Take 1 tablet (10 mEq total) by mouth 2 (two) times daily. 10 tablet 0  . sucralfate (CARAFATE) 1 g tablet Take 1 tablet (1 g total) by mouth 4 (four) times daily -  with meals and at bedtime. 40 tablet 0   No current facility-administered medications for this visit.     Allergies as of 09/27/2018 - Review Complete 09/16/2018  Allergen Reaction Noted  . Contrast media [iodinated diagnostic agents] Hives and Itching 05/01/2015  . Penicillins Other (See Comments) 04/23/2015    Family History  Problem Relation Age of Onset  . Healthy Mother     Social History   Socioeconomic History  . Marital status: Married    Spouse name: Not on file  . Number  of children: 2  . Years of education: Not on file  . Highest education level: Not on file  Occupational History  . Occupation: Agricultural consultant  Social Needs  . Financial resource strain: Not on file  . Food insecurity:    Worry: Not on file    Inability: Not on file  . Transportation needs:    Medical: Not on file    Non-medical: Not on file  Tobacco Use  . Smoking status: Former Smoker    Packs/day: 0.50    Years: 10.00    Pack years: 5.00    Types: Cigarettes  . Smokeless tobacco: Never Used  Substance and Sexual Activity  . Alcohol use: No  . Drug use: Yes    Types: Marijuana  . Sexual activity: Yes  Lifestyle  . Physical activity:    Days per week: Not on file    Minutes per session: Not on file  . Stress: Not on file  Relationships  . Social connections:    Talks on phone: Not on file    Gets together: Not on file    Attends religious service: Not on file    Active member of club or organization: Not on file    Attends meetings of clubs or organizations: Not on file  Relationship status: Not on file  . Intimate partner violence:    Fear of current or ex partner: Not on file    Emotionally abused: Not on file    Physically abused: Not on file    Forced sexual activity: Not on file  Other Topics Concern  . Not on file  Social History Narrative  . Not on file    Review of Systems: ALL ROS discussed and all others negative except listed in HPI.  Physical Exam: General: in no acute distress Neuro: Alert and appropriate Psych: Normal affect and normal insight Exam otherwise limited given the telehealth encounter.   Axel Frisk L. Tarri Glenn, MD, MPH [facility] 09/27/2018, 9:54 AM

## 2018-09-27 NOTE — Patient Instructions (Addendum)
Continue to take Protonix 40 mg daily.  Use ondansetron as needed for nausea.  I am recommending the addition of famotidine 20 mg every morning given your early morning symptoms. This has been sent to your pharmacy.   Continue to avoid all nonsteroidal anti-inflammatory medications and marijuana as both of these may be contributing to your symptoms.  Please come to the lab sometime this week (the lab is open Mon-Fri 8 am-4 pm and is located in the basement of our building) so that we can follow-up on your potassium and sodium levels.  We will contact you to schedule an upper endoscopy when the coronavirus restrictions have been lifted.  Please call in 2 to 3 weeks if your symptoms have not improved or earlier with any questions or concerns.

## 2018-09-30 ENCOUNTER — Encounter: Payer: Self-pay | Admitting: Physician Assistant

## 2018-10-01 ENCOUNTER — Other Ambulatory Visit: Payer: Self-pay

## 2018-10-01 ENCOUNTER — Ambulatory Visit (INDEPENDENT_AMBULATORY_CARE_PROVIDER_SITE_OTHER): Payer: 59 | Admitting: Physician Assistant

## 2018-10-01 ENCOUNTER — Encounter: Payer: Self-pay | Admitting: Physician Assistant

## 2018-10-01 DIAGNOSIS — F411 Generalized anxiety disorder: Secondary | ICD-10-CM | POA: Diagnosis not present

## 2018-10-01 MED ORDER — ESCITALOPRAM OXALATE 10 MG PO TABS: 10.0000 mg | ORAL_TABLET | Freq: Every day | ORAL | 3 refills | Status: DC

## 2018-10-01 MED ORDER — CLONAZEPAM 0.5 MG PO TABS: 0.5000 mg | ORAL_TABLET | Freq: Two times a day (BID) | ORAL | 0 refills | Status: DC | PRN

## 2018-10-01 NOTE — Progress Notes (Signed)
Virtual Visit via Video   I connected with patient on 10/01/18 at  9:20 AM EDT by a video enabled telemedicine application and verified that I am speaking with the correct person using two identifiers.  Location patient: Home Location provider: [facility] Persons participating in the virtual visit: Patient, Provider, Paul Arnold (Paul Arnold)  I discussed the limitations of evaluation and management by telemedicine and the availability of in person appointments. The patient expressed understanding and agreed to proceed.  Subjective:   HPI:   Patient presents via [facility] today for further management of anxiety. Patient with history of GAD, previously decently controlled with only rare use of Klonopin. Now with GI issues, anxiety has increased as he has stopped smoking tobacco and using THC. Also notes significant increase in anxiety 2/2 financial stressors and worrying about his family during the [place] pandemic. Is currently having to use his Klonopin 1-2 x daily. Notes increased irritability. Denies depressed mood or anhedonia. Denies SI/HI. Would like to discuss options to help with anxiety.   ROS:   See pertinent positives and negatives per HPI.  Patient Active Problem List   Diagnosis Date Noted  . GAD (generalized anxiety disorder) 01/06/2018  . Acute appendicitis 04/23/2015    Social History   Tobacco Use  . Smoking status: Former Smoker    Packs/day: 0.50    Years: 10.00    Pack years: 5.00    Types: Cigarettes  . Smokeless tobacco: Never Used  Substance Use Topics  . Alcohol use: No    Current Outpatient Medications:  .  beclomethasone (QVAR) 80 MCG/ACT inhaler, Inhale 1 puff into the lungs 2 (two) times daily. (Patient taking differently: Inhale 1 puff into the lungs 2 (two) times daily as needed (for flares). ), Disp: 10.6 g, Rfl: 0 .  clonazePAM (KLONOPIN) 0.5 MG tablet, Take 1 tablet (0.5 mg total) by mouth 2 (two) times daily as needed., Disp: 60  tablet, Rfl: 0 .  dicyclomine (BENTYL) 10 MG capsule, Take 2 capsules (20 mg total) by mouth 4 (four) times daily as needed for spasms., Disp: 240 capsule, Rfl: 3 .  famotidine (PEPCID) 20 MG tablet, Take 1 tablet (20 mg total) by mouth every morning., Disp: 30 tablet, Rfl: 3 .  fluticasone (FLONASE) 50 MCG/ACT nasal spray, Place 2 sprays into both nostrils daily as needed for rhinitis (or seasonal allergies). , Disp: , Rfl:  .  ondansetron (ZOFRAN ODT) 8 MG disintegrating tablet, Take 1 tablet (8 mg total) by mouth every 8 (eight) hours as needed for nausea or vomiting., Disp: 50 tablet, Rfl: 0 .  pantoprazole (PROTONIX) 40 MG tablet, Take 1 tablet (40 mg total) by mouth daily., Disp: 30 tablet, Rfl: 3 .  potassium chloride (K-DUR) 10 MEQ tablet, Take 1 tablet (10 mEq total) by mouth 2 (two) times daily., Disp: 10 tablet, Rfl: 0 .  sucralfate (CARAFATE) 1 g tablet, Take 1 tablet (1 g total) by mouth 4 (four) times daily -  with meals and at bedtime., Disp: 40 tablet, Rfl: 0 .  escitalopram (LEXAPRO) 10 MG tablet, Take 1 tablet (10 mg total) by mouth daily., Disp: 30 tablet, Rfl: 3  Allergies  Allergen Reactions  . Contrast Media [Iodinated Diagnostic Agents] Hives and Itching    Itching , hives on 04-23-15 CT exam 05-10-15---pt got 1 hr emergent prep in ED for CT -dosed with Barium--no reaction noted  . Penicillins Other (See Comments)    From childhood- reaction not recalled Has patient had  a PCN reaction causing immediate rash, facial/tongue/throat swelling, SOB or lightheadedness with hypotension: Unk Has patient had a PCN reaction causing severe rash involving mucus membranes or skin necrosis: Unk Has patient had a PCN reaction that required hospitalization: Unk Has patient had a PCN reaction occurring within the last 10 years: No If all of the above answers are "NO", then may proceed with Cephalosporin use.     Objective:   Pulse (!) 101   Patient is well-developed, well-nourished  in no acute distress.  Resting comfortably at home.  Head is normocephalic, atraumatic.  No labored breathing.  Speech is clear and coherent with logical contest.  Patient is alert and oriented at baseline.   Assessment and Plan:   1. GAD (generalized anxiety disorder) Will start Lexapro 10 mg daily. Will increase Klonopin to up to BID for the next month while the SSRI starts to get into his system. Follow-up 2 weeks for reassessment so we can decide on early titration of medication.   - escitalopram (LEXAPRO) 10 MG tablet; Take 1 tablet (10 mg total) by mouth daily.  Dispense: 30 tablet; Refill: 3 - clonazePAM (KLONOPIN) 0.5 MG tablet; Take 1 tablet (0.5 mg total) by mouth 2 (two) times daily as needed.  Dispense: 60 tablet; Refill: Paul Arnold, Paul Arnold 10/01/2018

## 2018-10-01 NOTE — Progress Notes (Signed)
I have discussed the procedure for the virtual visit with the patient who has given consent to proceed with assessment and treatment.   Damareon Lanni S Kanya Potteiger, CMA     

## 2018-10-18 ENCOUNTER — Other Ambulatory Visit: Payer: Self-pay | Admitting: Emergency Medicine

## 2018-10-18 DIAGNOSIS — F1290 Cannabis use, unspecified, uncomplicated: Secondary | ICD-10-CM

## 2018-10-18 DIAGNOSIS — R112 Nausea with vomiting, unspecified: Secondary | ICD-10-CM

## 2018-10-18 DIAGNOSIS — R1116 Cannabis hyperemesis syndrome: Secondary | ICD-10-CM

## 2018-10-19 ENCOUNTER — Telehealth: Payer: Self-pay | Admitting: *Deleted

## 2018-10-19 NOTE — Telephone Encounter (Signed)
Covid-19 screening questions  Have you traveled in the last 14 days?no If yes where?  Do you now or have you had a fever in the last 14 days?no  Do you have any respiratory symptoms of shortness of breath or cough now or in the last 14 days?no  Do you have any family members or close contacts with diagnosed or suspected Covid-19 in the past 14 days?no  Have you been tested for Covid-19 and found to be positive?no  Pt made aware of care partner policy and will bring a mask with him. SM

## 2018-10-21 ENCOUNTER — Ambulatory Visit (AMBULATORY_SURGERY_CENTER): Payer: 59 | Admitting: Gastroenterology

## 2018-10-21 ENCOUNTER — Other Ambulatory Visit: Payer: Self-pay

## 2018-10-21 ENCOUNTER — Encounter: Payer: Self-pay | Admitting: Gastroenterology

## 2018-10-21 VITALS — BP 99/50 | HR 60 | Temp 98.6°F | Resp 15 | Ht 67.0 in | Wt 120.0 lb

## 2018-10-21 DIAGNOSIS — K209 Esophagitis, unspecified: Secondary | ICD-10-CM

## 2018-10-21 DIAGNOSIS — R112 Nausea with vomiting, unspecified: Secondary | ICD-10-CM

## 2018-10-21 MED ORDER — SODIUM CHLORIDE 0.9 % IV SOLN: 500.0000 mL | Freq: Once | INTRAVENOUS | Status: DC

## 2018-10-21 NOTE — Op Note (Addendum)
[facility] Patient Name: Paul Arnold Procedure Date: 10/21/2018 10:10 AM MRN: [id number] Endoscopist: Thornton Park MD, MD Age: 38 Referring MD:  Date of Birth: 1981/04/19 Gender: Male Account #: [id number] Procedure:                Upper GI endoscopy Indications:              Generalized abdominal pain, Nausea with vomiting                           Nausea, vomiting, abdominal pain                           - CT abd/pelvis with contrast 09/10/18: no acute                            findings                           - normal LFTs and lipase 09/10/18                           - suspected Cannabinoid hyperemesis syndrome                           Urine tox + THC                           Frequent NSAIDs - ibuprofen and Goody's powder                           Recent hypokalemia                           Recent hyponatremia                           History of Crohns versus IBS in 1998 Medicines:                See the Anesthesia note for documentation of the                            administered medications Procedure:                Pre-Anesthesia Assessment:                           - Prior to the procedure, a History and Physical                            was performed, and patient medications and                            allergies were reviewed. The patient's tolerance of                            previous anesthesia was also reviewed. The risks  and benefits of the procedure and the sedation                            options and risks were discussed with the patient.                            All questions were answered, and informed consent                            was obtained. Prior Anticoagulants: The patient has                            taken no previous anticoagulant or antiplatelet                            agents. ASA Grade Assessment: I - A normal, healthy                            patient. After reviewing the risks  and benefits,                            the patient was deemed in satisfactory condition to                            undergo the procedure.                           After obtaining informed consent, the endoscope was                            passed under direct vision. Throughout the                            procedure, the patient's blood pressure, pulse, and                            oxygen saturations were monitored continuously. The                            Endoscope was introduced through the mouth, and                            advanced to the second part of duodenum. The upper                            GI endoscopy was accomplished without difficulty.                            The patient tolerated the procedure well. Scope In: Scope Out: Findings:                 The examined esophagus was normal. Biopsies were  taken with a cold forceps for histology. Estimated                            blood loss was minimal.                           Diffuse mildly erythematous mucosa without bleeding                            was found in the gastric fundus. Biopsies were                            taken with a cold forceps for histology.                           The examined duodenum was normal. Biopsies were                            taken with a cold forceps for histology. Estimated                            blood loss was minimal.                           The cardia and gastric fundus were normal on                            retroflexion. The pyloric opening was extremely                            large.                           The exam was otherwise without abnormality. Complications:            No immediate complications. Estimated blood loss:                            Minimal. Estimated Blood Loss:     Estimated blood loss was minimal. Impression:               - Normal esophagus. Biopsied.                           - Erythematous  mucosa in the gastric fundus.                            Biopsied.                           - Normal examined duodenum. Biopsied.                           - The examination was otherwise normal. Recommendation:           - Patient has a contact number available for  emergencies. The signs and symptoms of potential                            delayed complications were discussed with the                            patient. Return to normal activities tomorrow.                            Written discharge instructions were provided to the                            patient.                           - Resume regular diet today.                           - Continue present medications. I did not make any                            changes to your medications today.                           - Await pathology results.                           - Repeat upper endoscopy is not recommended at this                            time.                           - Follow-up in the office (could be virtual                            encounter) to review these results. Thornton Park MD, MD 10/21/2018 10:33:08 AM This report has been signed electronically.

## 2018-10-21 NOTE — Progress Notes (Signed)
Report given to PACU, vss 

## 2018-10-21 NOTE — Progress Notes (Signed)
[facility] took temp and [facility] took vitals.

## 2018-10-21 NOTE — Progress Notes (Signed)
Pt's states no medical or surgical changes since previsit or office visit. 

## 2018-10-21 NOTE — Patient Instructions (Signed)
YOU HAD AN ENDOSCOPIC PROCEDURE TODAY AT THE [facility]:   Refer to the procedure report that was given to you for any specific questions about what was found during the examination.  If the procedure report does not answer your questions, please call your gastroenterologist to clarify.  If you requested that your care partner not be given the details of your procedure findings, then the procedure report has been included in a sealed envelope for you to review at your convenience later.  YOU SHOULD EXPECT: Some feelings of bloating in the abdomen. Passage of more gas than usual.  Walking can help get rid of the air that was put into your GI tract during the procedure and reduce the bloating. If you had a lower endoscopy (such as a colonoscopy or flexible sigmoidoscopy) you may notice spotting of blood in your stool or on the toilet paper. If you underwent a bowel prep for your procedure, you may not have a normal bowel movement for a few days.  Please Note:  You might notice some irritation and congestion in your nose or some drainage.  This is from the oxygen used during your procedure.  There is no need for concern and it should clear up in a day or so.  SYMPTOMS TO REPORT IMMEDIATELY:   Following upper endoscopy (EGD)  Vomiting of blood or coffee ground material  New chest pain or pain under the shoulder blades  Painful or persistently difficult swallowing  New shortness of breath  Fever of 100F or higher  Black, tarry-looking stools  For urgent or emergent issues, a gastroenterologist can be reached at any hour by calling [phone].   DIET:  We do recommend a small meal at first, but then you may proceed to your regular diet.  Drink plenty of fluids but you should avoid alcoholic beverages for 24 hours.  ACTIVITY:  You should plan to take it easy for the rest of today and you should NOT DRIVE or use heavy machinery until tomorrow (because of the sedation medicines used  during the test).    FOLLOW UP: Our staff will call the number listed on your records 48-72 hours following your procedure to check on you and address any questions or concerns that you may have regarding the information given to you following your procedure. If we do not reach you, we will leave a message.  We will attempt to reach you two times.  During this call, we will ask if you have developed any symptoms of COVID 19. If you develop any symptoms (ie: fever, flu-like symptoms, shortness of breath, cough etc.) before then, please call [phone].  If you test positive for Covid 19 in the 2 weeks post procedure, please call and report this information to us.    If any biopsies were taken you will be contacted by phone or by letter within the next 1-3 weeks.  Please call us at [phone] if you have not heard about the biopsies in 3 weeks.    SIGNATURES/CONFIDENTIALITY: You and/or your care partner have signed paperwork which will be entered into your electronic medical record.  These signatures attest to the fact that that the information above on your After Visit Summary has been reviewed and is understood.  Full responsibility of the confidentiality of this discharge information lies with you and/or your care-partner. 

## 2018-10-21 NOTE — Progress Notes (Signed)
Called to room to assist during endoscopic procedure.  Patient ID and intended procedure confirmed with present staff. Received instructions for my participation in the procedure from the performing physician.  

## 2018-10-25 ENCOUNTER — Telehealth: Payer: Self-pay | Admitting: *Deleted

## 2018-10-25 NOTE — Telephone Encounter (Signed)
  Follow up Call-  Call back number 10/21/2018  Post procedure Call Back phone  # [phone]  Permission to leave phone message Yes  Some recent data might be hidden     Patient questions:  Do you have a fever, pain , or abdominal swelling? No. Pain Score  0 *  Have you tolerated food without any problems? Yes.    Have you been able to return to your normal activities? Yes.    Do you have any questions about your discharge instructions: Diet   No. Medications  No. Follow up visit  No.  Do you have questions or concerns about your Care? No.  Actions: * If pain score is 4 or above: No action needed, pain <4.  1. Have you developed a fever since your procedure? no  2.   Have you had an respiratory symptoms (SOB or cough) since your procedure? no  3.   Have you tested positive for COVID 19 since your procedure no  4.   Have you had any family members/close contacts diagnosed with the COVID 19 since your procedure?  no   If yes to any of these questions please route to Joylene John, RN and Alphonsa Gin, Therapist, sports.

## 2018-10-26 ENCOUNTER — Encounter: Payer: Self-pay | Admitting: Gastroenterology

## 2018-10-26 ENCOUNTER — Other Ambulatory Visit: Payer: Self-pay | Admitting: Physician Assistant

## 2018-10-26 DIAGNOSIS — F411 Generalized anxiety disorder: Secondary | ICD-10-CM

## 2018-10-27 ENCOUNTER — Telehealth: Payer: Self-pay | Admitting: Gastroenterology

## 2018-10-27 NOTE — Telephone Encounter (Signed)
Patient calling to know his lab result.

## 2018-10-27 NOTE — Telephone Encounter (Signed)
Spoke to the patient. Letter was sent on 6/10 explaining the biopsy results. Nothing further at the time of the phone call.

## 2018-10-28 ENCOUNTER — Telehealth: Payer: Self-pay | Admitting: Gastroenterology

## 2018-10-28 NOTE — Telephone Encounter (Signed)
Spoke to the patient who notified this RN that he was unable to view the letter Dr. Tarri Glenn sent via [facility] but had assistance in getting it up immediately prior to this RN's return phone call.

## 2018-10-28 NOTE — Telephone Encounter (Signed)
Patient called In wanting to speak with the nurse about pathology results.

## 2018-11-03 ENCOUNTER — Encounter: Payer: Self-pay | Admitting: Physician Assistant

## 2018-11-04 ENCOUNTER — Other Ambulatory Visit: Payer: Self-pay

## 2018-11-04 ENCOUNTER — Encounter: Payer: Self-pay | Admitting: Physician Assistant

## 2018-11-04 ENCOUNTER — Ambulatory Visit: Payer: 59 | Admitting: Physician Assistant

## 2018-11-04 VITALS — BP 100/60 | HR 89 | Temp 99.0°F | Resp 16 | Ht 67.0 in | Wt 127.8 lb

## 2018-11-04 DIAGNOSIS — L03115 Cellulitis of right lower limb: Secondary | ICD-10-CM

## 2018-11-04 MED ORDER — DOXYCYCLINE HYCLATE 100 MG PO TABS: 100.0000 mg | ORAL_TABLET | Freq: Two times a day (BID) | ORAL | 0 refills | Status: DC

## 2018-11-04 MED ORDER — TRAMADOL HCL 50 MG PO TABS: 50.0000 mg | ORAL_TABLET | Freq: Three times a day (TID) | ORAL | 0 refills | Status: AC | PRN

## 2018-11-04 NOTE — Patient Instructions (Signed)
Please take the antibiotic as directed with food. Keep skin clean and dry. Can apply warm compress to the area a few times a day to help promote any potential drainage.   Tramadol is to use for more significant pain. Tylenol for mild pain. No GOODY or BC POWDERS!  If the redness extends past the drawn area or you note fever, worsening pain or inability to move the knee, you need ER assessment.   Otherwise we will follow-up with you on Monday via phone to see how you are doing  Elbert Ewings in there!

## 2018-11-04 NOTE — Telephone Encounter (Signed)
Please call patient to schedule appointment for today. Can be in-office if passing COVID screen.

## 2018-11-04 NOTE — Progress Notes (Signed)
Patient presents to clinic today c/o painful area of redness, tenderness of medial left knee first noted Monday.  The area has since that has gotten larger and does itch somewhat. Does not recall getting bit by anything. 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 Denies any other areas on body or having anything like this before. Reports some thick drainage from the area yesterday but no recurrence today. Notes some decreased ROM due to pain but denies true inability to complete normal ROM.  Notes an 8/10 constant pain, Taken BC powders with little relief, hydrogen peroxide, neosporin, and alcohol to clean area. Denies fever, chills, chest pain, shortness of breath   Past Medical History:  Diagnosis Date  . Appendicitis   . Asthma   . Crohn disease ([place])   . Periodontitis     Current Outpatient Medications on File Prior to Visit  Medication Sig Dispense Refill  . beclomethasone (QVAR) 80 MCG/ACT inhaler Inhale 1 puff into the lungs 2 (two) times daily. (Patient taking differently: Inhale 1 puff into the lungs 2 (two) times daily as needed (for flares). ) 10.6 g 0  . clonazePAM (KLONOPIN) 0.5 MG tablet Take 1 tablet (0.5 mg total) by mouth 2 (two) times daily as needed. 60 tablet 0  . dicyclomine (BENTYL) 10 MG capsule Take 2 capsules (20 mg total) by mouth 4 (four) times daily as needed for spasms. 240 capsule 3  . escitalopram (LEXAPRO) 10 MG tablet TAKE 1 TABLET BY MOUTH EVERY DAY 90 tablet 0  . famotidine (PEPCID) 20 MG tablet Take 1 tablet (20 mg total) by mouth every morning. 30 tablet 3  . fluticasone (FLONASE) 50 MCG/ACT nasal spray Place 2 sprays into both nostrils daily as needed for rhinitis (or seasonal allergies).     . ondansetron (ZOFRAN ODT) 8 MG disintegrating tablet Take 1 tablet (8 mg total) by mouth every 8 (eight) hours as needed for nausea or vomiting. 50 tablet 0  . pantoprazole (PROTONIX) 40 MG tablet Take 1 tablet (40 mg total) by mouth daily. 30 tablet 3   . potassium chloride (K-DUR) 10 MEQ tablet Take 1 tablet (10 mEq total) by mouth 2 (two) times daily. 10 tablet 0  . sucralfate (CARAFATE) 1 g tablet Take 1 tablet (1 g total) by mouth 4 (four) times daily -  with meals and at bedtime. 40 tablet 0   No current facility-administered medications on file prior to visit.     Allergies  Allergen Reactions  . Contrast Media [Iodinated Diagnostic Agents] Hives and Itching    Itching , hives on 04-23-15 CT exam 05-10-15---pt got 1 hr emergent prep in ED for CT -dosed with Barium--no reaction noted  . Penicillins Other (See Comments)    From childhood- reaction not recalled Has patient had a PCN reaction causing immediate rash, facial/tongue/throat swelling, SOB or lightheadedness with hypotension: Unk Has patient had a PCN reaction causing severe rash involving mucus membranes or skin necrosis: Unk Has patient had a PCN reaction that required hospitalization: Unk Has patient had a PCN reaction occurring within the last 10 years: No If all of the above answers are "NO", then may proceed with Cephalosporin use.     Family History  Problem Relation Age of Onset  . Healthy Mother   . Breast cancer Mother   . Ovarian cancer Mother   . Colon cancer Neg Hx   . Esophageal cancer Neg Hx   . Rectal cancer Neg Hx   .  Stomach cancer Neg Hx     Social History   Socioeconomic History  . Marital status: Married    Spouse name: Not on file  . Number of children: 2  . Years of education: Not on file  . Highest education level: Not on file  Occupational History  . Occupation: Agricultural consultant  Social Needs  . Financial resource strain: Not on file  . Food insecurity    Worry: Not on file    Inability: Not on file  . Transportation needs    Medical: Not on file    Non-medical: Not on file  Tobacco Use  . Smoking status: Former Smoker    Packs/day: 0.50    Years: 10.00    Pack years: 5.00    Types: Cigarettes  . Smokeless tobacco: Never Used   Substance and Sexual Activity  . Alcohol use: No  . Drug use: Yes    Types: Marijuana  . Sexual activity: Yes  Lifestyle  . Physical activity    Days per week: Not on file    Minutes per session: Not on file  . Stress: Not on file  Relationships  . Social Herbalist on phone: Not on file    Gets together: Not on file    Attends religious service: Not on file    Active member of club or organization: Not on file    Attends meetings of clubs or organizations: Not on file    Relationship status: Not on file  Other Topics Concern  . Not on file  Social History Narrative  . Not on file   Review of Systems - See HPI.  All other ROS are negative.  There were no vitals taken for this visit.  Physical Exam  Recent Results (from the past 2160 hour(s))  Basic metabolic panel     Status: Abnormal   Collection Time: 09/08/18 11:23 AM  Result Value Ref Range   Sodium 132 (L) 135 - 145 mmol/L   Potassium 2.8 (L) 3.5 - 5.1 mmol/L   Chloride 84 (L) 98 - 111 mmol/L   CO2 29 22 - 32 mmol/L   Glucose, Bld 118 (H) 70 - 99 mg/dL   BUN 22 (H) 6 - 20 mg/dL   Creatinine, Ser 1.30 (H) 0.61 - 1.24 mg/dL   Calcium 9.5 8.9 - 10.3 mg/dL   GFR calc non Af Amer >60 >60 mL/min   GFR calc Af Amer >60 >60 mL/min   Anion gap 19 (H) 5 - 15    Comment: Performed at [facility], [address] [place], [place] [address]  CBC with Differential     Status: Abnormal   Collection Time: 09/10/18  3:58 PM  Result Value Ref Range   WBC 9.9 4.0 - 10.5 K/uL   RBC 4.18 (L) 4.22 - 5.81 MIL/uL   Hemoglobin 13.9 13.0 - 17.0 g/dL   HCT 38.8 (L) 39.0 - 52.0 %   MCV 92.8 80.0 - 100.0 fL   MCH 33.3 26.0 - 34.0 pg   MCHC 35.8 30.0 - 36.0 g/dL   RDW 11.3 (L) 11.5 - 15.5 %   Platelets 183 150 - 400 K/uL   nRBC 0.0 0.0 - 0.2 %   Neutrophils Relative % 65 %   Neutro Abs 6.4 1.7 - 7.7 K/uL   Lymphocytes Relative 26 %   Lymphs Abs 2.6 0.7 - 4.0 K/uL   Monocytes Relative 8 %   Monocytes Absolute  0.8  0.1 - 1.0 K/uL   Eosinophils Relative 0 %   Eosinophils Absolute 0.0 0.0 - 0.5 K/uL   Basophils Relative 1 %   Basophils Absolute 0.1 0.0 - 0.1 K/uL   Immature Granulocytes 0 %   Abs Immature Granulocytes 0.02 0.00 - 0.07 K/uL    Comment: Performed at [facility] [address], [place], [place] [address]  Comprehensive metabolic panel     Status: Abnormal   Collection Time: 09/10/18  3:58 PM  Result Value Ref Range   Sodium 133 (L) 135 - 145 mmol/L   Potassium 3.0 (L) 3.5 - 5.1 mmol/L   Chloride 94 (L) 98 - 111 mmol/L   CO2 26 22 - 32 mmol/L   Glucose, Bld 96 70 - 99 mg/dL   BUN 12 6 - 20 mg/dL   Creatinine, Ser 1.03 0.61 - 1.24 mg/dL   Calcium 8.7 (L) 8.9 - 10.3 mg/dL   Total Protein 6.9 6.5 - 8.1 g/dL   Albumin 4.2 3.5 - 5.0 g/dL   AST 20 15 - 41 U/L   ALT 23 0 - 44 U/L   Alkaline Phosphatase 48 38 - 126 U/L   Total Bilirubin 1.1 0.3 - 1.2 mg/dL   GFR calc non Af Amer >60 >60 mL/min   GFR calc Af Amer >60 >60 mL/min   Anion gap 13 5 - 15    Comment: Performed at [facility], [place] [address] [place], [place] [address]  Lipase, blood     Status: None   Collection Time: 09/10/18  3:58 PM  Result Value Ref Range   Lipase 25 11 - 51 U/L    Comment: Performed at [facility] [address], [place], [place] [address]  Urinalysis, Routine w reflex microscopic     Status: Abnormal   Collection Time: 09/10/18  7:20 PM  Result Value Ref Range   Color, Urine YELLOW YELLOW   APPearance CLEAR CLEAR   Specific Gravity, Urine 1.008 1.005 - 1.030   pH 8.0 5.0 - 8.0   Glucose, UA NEGATIVE NEGATIVE mg/dL   Hgb urine dipstick NEGATIVE NEGATIVE   Bilirubin Urine NEGATIVE NEGATIVE   Ketones, ur 20 (A) NEGATIVE mg/dL   Protein, ur NEGATIVE NEGATIVE mg/dL   Nitrite NEGATIVE NEGATIVE   Leukocytes,Ua NEGATIVE NEGATIVE    Comment: Performed at [facility] [address] [place],  [address]  Rapid urine drug screen (hospital performed)     Status:  Abnormal   Collection Time: 09/10/18  7:22 PM  Result Value Ref Range   Opiates NONE DETECTED NONE DETECTED   Cocaine NONE DETECTED NONE DETECTED   Benzodiazepines NONE DETECTED NONE DETECTED   Amphetamines NONE DETECTED NONE DETECTED   Tetrahydrocannabinol POSITIVE (A) NONE DETECTED   Barbiturates NONE DETECTED NONE DETECTED    Comment: (NOTE) DRUG SCREEN FOR MEDICAL PURPOSES ONLY.  IF CONFIRMATION IS NEEDED FOR ANY PURPOSE, NOTIFY LAB WITHIN 5 DAYS. LOWEST DETECTABLE LIMITS FOR URINE DRUG SCREEN Drug Class                     Cutoff (ng/mL) Amphetamine and metabolites    1000 Barbiturate and metabolites    200 Benzodiazepine                 818 Tricyclics and metabolites     300 Opiates and metabolites        300 Cocaine and metabolites  300 THC                            50 Performed at [facility], [place] [address], [place], [place] [address]    Assessment/Plan: 1. Cellulitis of right lower extremity Normal ROM of joint on exam today. Seems superficial cellulitis with prior abscess. Now no fluctuance, only induration. No drainage noted today. Will start Doxycycline BID x 7 days. Tramadol for severe pain, switching back to Tylenol as pain improves. Circle was drawn around the area of concern. He is to call immediately or go to ER if the redness passes this line or if there are any fever, vomiting. Follow-up Monday for reassessment. Can be video or phone.   - doxycycline (VIBRA-TABS) 100 MG tablet; Take 1 tablet (100 mg total) by mouth 2 (two) times daily.  Dispense: 14 tablet; Refill: 0 - traMADol (ULTRAM) 50 MG tablet; Take 1 tablet (50 mg total) by mouth every 8 (eight) hours as needed for up to 5 days.  Dispense: 5 tablet; Refill: 0   [name], PA-C

## 2018-11-08 ENCOUNTER — Emergency Department (HOSPITAL_COMMUNITY)
Admission: EM | Admit: 2018-11-08 | Discharge: 2018-11-08 | Disposition: A | Discharge status: Home / Self Care | Payer: 59 | Attending: Emergency Medicine | Admitting: Emergency Medicine

## 2018-11-08 ENCOUNTER — Encounter (HOSPITAL_COMMUNITY): Payer: Self-pay | Admitting: Emergency Medicine

## 2018-11-08 ENCOUNTER — Other Ambulatory Visit: Payer: Self-pay

## 2018-11-08 DIAGNOSIS — J45909 Unspecified asthma, uncomplicated: Secondary | ICD-10-CM | POA: Diagnosis not present

## 2018-11-08 DIAGNOSIS — L03115 Cellulitis of right lower limb: Secondary | ICD-10-CM

## 2018-11-08 DIAGNOSIS — R2241 Localized swelling, mass and lump, right lower limb: Secondary | ICD-10-CM | POA: Diagnosis present

## 2018-11-08 DIAGNOSIS — Z79899 Other long term (current) drug therapy: Secondary | ICD-10-CM | POA: Diagnosis not present

## 2018-11-08 DIAGNOSIS — Z87891 Personal history of nicotine dependence: Secondary | ICD-10-CM | POA: Diagnosis not present

## 2018-11-08 DIAGNOSIS — L02415 Cutaneous abscess of right lower limb: Secondary | ICD-10-CM | POA: Diagnosis not present

## 2018-11-08 DIAGNOSIS — L0291 Cutaneous abscess, unspecified: Secondary | ICD-10-CM

## 2018-11-08 MED ORDER — LIDOCAINE-EPINEPHRINE (PF) 2 %-1:200000 IJ SOLN
20.0000 mL | Freq: Once | INTRAMUSCULAR | Status: AC
  Administered 2018-11-08: 20 mL via INTRADERMAL
  Filled 2018-11-08: qty 20

## 2018-11-08 NOTE — ED Notes (Signed)
Pt refused discharge vital signs

## 2018-11-08 NOTE — ED Provider Notes (Signed)
[facility] DEPT Provider Note   CSN: [id number] Arrival date & time: 11/08/18  1135    History   Chief Complaint Chief Complaint  Patient presents with  . Insect Bite    HPI EYDAN CHIANESE is a 38 y.o. male.     HPI   DELVECCHIO MADOLE is a 38 y.o. male, with a history of asthma, presenting to the ED with area of redness and swelling to the right knee first noted about 5 days ago. He was seen by his PCP for this issue June 18, diagnosed with cellulitis, and placed on doxycycline.  In practicality, he has been taking the doxycycline for 2-1/2 days.  He was told if there was no improvement he would need to come to the ED. Pain is mild to moderate, described as a soreness, nonradiating from the area of the swelling and redness.  There has not been significant spread of the redness.  There has been purulent drainage from the wound.  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  Denies fever/chills, nausea/vomiting, pain in the knee joint, swelling to the knee, numbness, weakness, injury/trauma, or any other complaints.     Past Medical History:  Diagnosis Date  . Appendicitis   . Asthma   . Crohn disease ([place])   . Periodontitis     Patient Active Problem List   Diagnosis Date Noted  . GAD (generalized anxiety disorder) 01/06/2018  . Acute appendicitis 04/23/2015    Past Surgical History:  Procedure Laterality Date  . APPENDECTOMY  04/23/2015   laproscopic   . LAPAROSCOPIC APPENDECTOMY N/A 04/23/2015   Procedure: APPENDECTOMY LAPAROSCOPIC;  Surgeon: Judeth Horn, MD;  Location: [facility];  Service: General;  Laterality: N/A;        Home Medications    Prior to Admission medications   Medication Sig Start Date End Date Taking? Authorizing Provider  beclomethasone (QVAR) 80 MCG/ACT inhaler Inhale 1 puff into the lungs 2 (two) times daily. Patient taking differently: Inhale 1 puff into the  lungs 2 (two) times daily as needed (for flares).  02/15/18   Brunetta Jeans, PA-C  clonazePAM (KLONOPIN) 0.5 MG tablet Take 1 tablet (0.5 mg total) by mouth 2 (two) times daily as needed. 10/01/18   Brunetta Jeans, PA-C  doxycycline (VIBRA-TABS) 100 MG tablet Take 1 tablet (100 mg total) by mouth 2 (two) times daily. 11/04/18   Brunetta Jeans, PA-C  escitalopram (LEXAPRO) 10 MG tablet TAKE 1 TABLET BY MOUTH EVERY DAY 10/28/18   Brunetta Jeans, PA-C  famotidine (PEPCID) 20 MG tablet Take 1 tablet (20 mg total) by mouth every morning. 09/27/18   Thornton Park, MD  fluticasone (FLONASE) 50 MCG/ACT nasal spray Place 2 sprays into both nostrils daily as needed for rhinitis (or seasonal allergies).     [provider]  ondansetron (ZOFRAN ODT) 8 MG disintegrating tablet Take 1 tablet (8 mg total) by mouth every 8 (eight) hours as needed for nausea or vomiting. 09/27/18   Thornton Park, MD  pantoprazole (PROTONIX) 40 MG tablet Take 1 tablet (40 mg total) by mouth daily. 09/16/18   Thornton Park, MD  potassium chloride (K-DUR) 10 MEQ tablet Take 1 tablet (10 mEq total) by mouth 2 (two) times daily. 09/08/18   Vanessa Kick, MD  sucralfate (CARAFATE) 1 g tablet Take 1 tablet (1 g total) by mouth 4 (four) times daily -  with meals  and at bedtime. 09/13/18   Brunetta Jeans, PA-C  traMADol (ULTRAM) 50 MG tablet Take 1 tablet (50 mg total) by mouth every 8 (eight) hours as needed for up to 5 days. 11/04/18 11/09/18  Brunetta Jeans, PA-C    Family History Family History  Problem Relation Age of Onset  . Healthy Mother   . Breast cancer Mother   . Ovarian cancer Mother   . Colon cancer Neg Hx   . Esophageal cancer Neg Hx   . Rectal cancer Neg Hx   . Stomach cancer Neg Hx     Social History Social History   Tobacco Use  . Smoking status: Former Smoker    Packs/day: 0.50    Years: 10.00    Pack years: 5.00    Types: Cigarettes  . Smokeless tobacco: Never Used  Substance  Use Topics  . Alcohol use: No  . Drug use: Yes    Types: Marijuana     Allergies   Contrast media [iodinated diagnostic agents] and Penicillins   Review of Systems Review of Systems  Constitutional: Negative for chills and fever.  Gastrointestinal: Negative for nausea and vomiting.  Musculoskeletal: Negative for arthralgias and joint swelling.  Skin: Positive for wound.  Neurological: Negative for weakness and numbness.  All other systems reviewed and are negative.    Physical Exam Updated Vital Signs BP 107/78 (BP Location: Left Arm)   Pulse 86   Temp 98.7 F (37.1 C) (Oral)   Resp 17   SpO2 100%   Physical Exam Vitals signs and nursing note reviewed.  Constitutional:      General: He is not in acute distress.    Appearance: He is well-developed. He is not diaphoretic.  HENT:     Head: Normocephalic and atraumatic.     Mouth/Throat:     Mouth: Mucous membranes are moist.     Pharynx: Oropharynx is clear.  Eyes:     Conjunctiva/sclera: Conjunctivae normal.  Neck:     Musculoskeletal: Neck supple.  Cardiovascular:     Rate and Rhythm: Normal rate and regular rhythm.     Pulses: Normal pulses.          Dorsalis pedis pulses are 2+ on the right side.       Posterior tibial pulses are 2+ on the right side and 2+ on the left side.  Pulmonary:     Effort: Pulmonary effort is normal. No respiratory distress.  Abdominal:     Tenderness: There is no guarding.  Musculoskeletal:        General: No swelling or tenderness.     Right lower leg: No edema.     Left lower leg: No edema.     Comments: There is tenderness over the area of the wound, however, there is no tenderness to the rest of the right knee.  No other areas of swelling, increased warmth, or erythema. I am able to grasp the knee during the exam with tight pressure without causing pain to the patient.  I can fully range the right knee without complaint from the patient.  It does not cause the patient pain to  put axial load from the heel through to the extended knee.  Lymphadenopathy:     Cervical: No cervical adenopathy.  Skin:    General: Skin is warm and dry.     Comments: Concise area of swelling, tenderness, and mild fluctuance to the right medial knee.  No active drainage.  Small area of  surrounding erythema also noted.  Neurological:     Mental Status: He is alert.     Comments: Sensation to light touch grossly intact in the right lower extremity. Strength 5/5 in the right knee. Ambulatory without gait deficit.  Psychiatric:        Mood and Affect: Mood and affect normal.        Speech: Speech normal.        Behavior: Behavior normal.            ED Treatments / Results  Labs (all labs ordered are listed, but only abnormal results are displayed) Labs Reviewed - No data to display  EKG None  Radiology No results found.  Procedures .Marland KitchenIncision and Drainage  Date/Time: 11/08/2018 1:10 PM Performed by: Lorayne Bender, PA-C Authorized by: Lorayne Bender, PA-C   Consent:    Consent obtained:  Verbal   Consent given by:  Patient   Risks discussed:  Bleeding, incomplete drainage and pain Location:    Type:  Abscess   Size:  1.5cm   Location:  Lower extremity   Lower extremity location:  Knee   Knee location:  R knee Pre-procedure details:    Skin preparation:  Betadine Anesthesia (see MAR for exact dosages):    Anesthesia method:  Local infiltration   Local anesthetic:  Lidocaine 2% WITH epi Procedure type:    Complexity:  Simple Procedure details:    Incision types:  Single straight   Incision depth:  Subcutaneous   Scalpel blade:  11   Wound management:  Probed and deloculated and irrigated with saline   Drainage:  Bloody and purulent   Drainage amount:  Scant   Wound treatment:  Wound left open Post-procedure details:    Patient tolerance of procedure:  Tolerated well, no immediate complications   (including critical care time)  Medications Ordered in ED  Medications  lidocaine-EPINEPHrine (XYLOCAINE W/EPI) 2 %-1:200000 (PF) injection 20 mL (20 mLs Intradermal Given by Other 11/08/18 1308)     Initial Impression / Assessment and Plan / ED Course  I have reviewed the triage vital signs and the nursing notes.  Pertinent labs & imaging results that were available during my care of the patient were reviewed by me and considered in my medical decision making (see chart for details).        Patient presents with a wound to the right knee.  Patient is nontoxic appearing, afebrile, not tachycardic, not tachypneic, not hypotensive, and is in no apparent distress.  Doubt sepsis. Presentation consistent with a superficial abscess and surrounding cellulitis.  Suspect the presence of an abscess was preventing improvement in the patient's cellulitis. My suspicion for septic joint is low based on timeline, patient presentation, and physical exam is not suggestive. We discussed further evaluation, including imaging, however, patient stated he did not have time because he needed to pick up his kids. Patient will return in 24 to 48 hours for repeat wound check. The patient was given instructions for home care as well as strict return precautions. Patient voices understanding of these instructions, accepts the plan, and is comfortable with discharge.   Final Clinical Impressions(s) / ED Diagnoses   Final diagnoses:  Cellulitis of right lower extremity  Abscess    ED Discharge Orders    None       Layla Maw 11/08/18 1406    Lennice Sites, DO 11/08/18 2156

## 2018-11-08 NOTE — ED Triage Notes (Signed)
Pt c/o spider bite on right knee since last Friday. Not improving with PO antibiotics; sent PCP for IV antibiotics.

## 2018-11-08 NOTE — Discharge Instructions (Signed)
Wound Care - After I&D of Abscess  You may remove the bandage after 24 hours.  The only reason to replace the bandage is to protect clothing from drainage. Bandages, if used, should be replaced daily or whenever soiled. The wound may continue to drain for the next 2-3 days.   Cleaning: Clean the wound and surrounding area gently with tap water and mild soap. Rinse well and blot dry. You may shower normally. Soaking the wound in Epsom salt baths for no more than 15 minutes once a day may help rinse out any remaining pus and help with wound healing.  Clean the wound daily to prevent further infection. Do not use cleaners such as hydrogen peroxide or alcohol.   Scar reduction: Application of a topical antibiotic ointment, such as Neosporin, after the wound has begun to close and heal well can decrease scab formation and reduce scarring. After the wound has healed, application of ointments such as Aquaphor can also reduce scar formation.  The key to scar reduction is keeping the skin well hydrated and supple. Drinking plenty of water throughout the day (At least eight 8oz glasses of water a day) is essential to staying well hydrated.  Sun exposure: Keep the wound out of the sun. After the wound has healed, continue to protect it from the sun by wearing protective clothing or applying sunscreen.  Pain: You may use Tylenol, naproxen, or ibuprofen for pain.  Prevention: There is some people that have a predisposition to abscess formation, however, there are some things that can be done to prevent abscesses in many people.  Most abscesses form because bacteria that naturally lives on the skin gets trapped underneath the skin.  This can occur through openings too small to see. Before and after any area of skin is shaved, wax, or abraded in any manner, the area should be washed with soap and water and rinsed well.   If you are having trouble with recurrent abscesses, it may be wise to perform a chlorhexidine  wash regimen.  For 1 week, wash all of your body with chlorhexidine (available over-the-counter at most pharmacies). You may also need to reevaluate your use of daily soap as soaps with perfumes or dyes can increase the chances of infection in some people.  Follow up: Please return to the ED in 24 to 48 hours for wound check.  Return: Return to the ED sooner should signs of worsening infection arise, such as spreading redness, worsening puffiness/swelling, severe increase in pain, fever over 100.58F, or any other major issues.  For prescription assistance, may try using prescription discount sites or apps, such as goodrx.com

## 2018-11-09 ENCOUNTER — Encounter (HOSPITAL_COMMUNITY): Payer: Self-pay

## 2018-11-09 ENCOUNTER — Emergency Department (HOSPITAL_COMMUNITY)
Admission: EM | Admit: 2018-11-09 | Discharge: 2018-11-09 | Disposition: A | Discharge status: Home / Self Care | Payer: 59 | Attending: Emergency Medicine | Admitting: Emergency Medicine

## 2018-11-09 DIAGNOSIS — L02415 Cutaneous abscess of right lower limb: Secondary | ICD-10-CM | POA: Insufficient documentation

## 2018-11-09 DIAGNOSIS — Z87891 Personal history of nicotine dependence: Secondary | ICD-10-CM | POA: Insufficient documentation

## 2018-11-09 DIAGNOSIS — J45909 Unspecified asthma, uncomplicated: Secondary | ICD-10-CM | POA: Insufficient documentation

## 2018-11-09 DIAGNOSIS — Z79899 Other long term (current) drug therapy: Secondary | ICD-10-CM | POA: Insufficient documentation

## 2018-11-09 DIAGNOSIS — Z5189 Encounter for other specified aftercare: Secondary | ICD-10-CM | POA: Insufficient documentation

## 2018-11-09 NOTE — ED Triage Notes (Addendum)
Patient states Paul Arnold told him to come back today so someone could recheck his abscess on right knee.   Patient states his abscess on right knee was drained yesterday.    A/ox4 Ambulatory in triage.   6/10 pain

## 2018-11-09 NOTE — ED Notes (Signed)
Bed: WTR7 Expected date:  Expected time:  Means of arrival:  Comments: 

## 2018-11-09 NOTE — ED Provider Notes (Signed)
[facility] DEPT Provider Note   CSN: [id number] Arrival date & time: 11/09/18  0940     History   Chief Complaint Chief Complaint  Patient presents with  . Wound Check    abcess on righ tknee    HPI Paul Arnold is a 38 y.o. male.     HPI Patient presents to the emergency department for recheck of a abscess that was drained yesterday.  Patient states it feels dramatically better and has no pain and has full range of motion of his knee.  Patient is on doxycycline. Past Medical History:  Diagnosis Date  . Appendicitis   . Asthma   . Crohn disease ([place])   . Periodontitis     Patient Active Problem List   Diagnosis Date Noted  . GAD (generalized anxiety disorder) 01/06/2018  . Acute appendicitis 04/23/2015    Past Surgical History:  Procedure Laterality Date  . APPENDECTOMY  04/23/2015   laproscopic   . LAPAROSCOPIC APPENDECTOMY N/A 04/23/2015   Procedure: APPENDECTOMY LAPAROSCOPIC;  Surgeon: Judeth Horn, MD;  Location: [facility];  Service: General;  Laterality: N/A;        Home Medications    Prior to Admission medications   Medication Sig Start Date End Date Taking? Authorizing Provider  beclomethasone (QVAR) 80 MCG/ACT inhaler Inhale 1 puff into the lungs 2 (two) times daily. Patient taking differently: Inhale 1 puff into the lungs 2 (two) times daily as needed (for flares).  02/15/18   Brunetta Jeans, PA-C  clonazePAM (KLONOPIN) 0.5 MG tablet Take 1 tablet (0.5 mg total) by mouth 2 (two) times daily as needed. 10/01/18   Brunetta Jeans, PA-C  doxycycline (VIBRA-TABS) 100 MG tablet Take 1 tablet (100 mg total) by mouth 2 (two) times daily. 11/04/18   Brunetta Jeans, PA-C  escitalopram (LEXAPRO) 10 MG tablet TAKE 1 TABLET BY MOUTH EVERY DAY 10/28/18   Brunetta Jeans, PA-C  famotidine (PEPCID) 20 MG tablet Take 1 tablet (20 mg total) by mouth every morning. 09/27/18   Thornton Park, MD  fluticasone (FLONASE) 50 MCG/ACT  nasal spray Place 2 sprays into both nostrils daily as needed for rhinitis (or seasonal allergies).     [provider]  ondansetron (ZOFRAN ODT) 8 MG disintegrating tablet Take 1 tablet (8 mg total) by mouth every 8 (eight) hours as needed for nausea or vomiting. 09/27/18   Thornton Park, MD  pantoprazole (PROTONIX) 40 MG tablet Take 1 tablet (40 mg total) by mouth daily. 09/16/18   Thornton Park, MD  potassium chloride (K-DUR) 10 MEQ tablet Take 1 tablet (10 mEq total) by mouth 2 (two) times daily. 09/08/18   Vanessa Kick, MD  sucralfate (CARAFATE) 1 g tablet Take 1 tablet (1 g total) by mouth 4 (four) times daily -  with meals and at bedtime. 09/13/18   Brunetta Jeans, PA-C  traMADol (ULTRAM) 50 MG tablet Take 1 tablet (50 mg total) by mouth every 8 (eight) hours as needed for up to 5 days. 11/04/18 11/09/18  Brunetta Jeans, PA-C    Family History Family History  Problem Relation Age of Onset  . Healthy Mother   . Breast cancer Mother   . Ovarian cancer Mother   . Colon cancer Neg Hx   . Esophageal cancer Neg Hx   . Rectal cancer Neg Hx   . Stomach cancer Neg Hx     Social History Social History   Tobacco Use  . Smoking status:  Former Smoker    Packs/day: 0.50    Years: 10.00    Pack years: 5.00    Types: Cigarettes  . Smokeless tobacco: Never Used  Substance Use Topics  . Alcohol use: No  . Drug use: Yes    Types: Marijuana     Allergies   Contrast media [iodinated diagnostic agents] and Penicillins   Review of Systems Review of Systems All other systems negative except as documented in the HPI. All pertinent positives and negatives as reviewed in the HPI.  Physical Exam Updated Vital Signs BP 106/64 (BP Location: Right Arm)   Pulse 77   Temp 98.4 F (36.9 C) (Oral)   Resp 16   SpO2 100%   Physical Exam Vitals signs and nursing note reviewed.  Constitutional:      General: He is not in acute distress.    Appearance: He is well-developed.   HENT:     Head: Normocephalic and atraumatic.  Eyes:     Pupils: Pupils are equal, round, and reactive to light.  Pulmonary:     Effort: Pulmonary effort is normal.  Musculoskeletal:       Legs:  Skin:    General: Skin is warm and dry.  Neurological:     Mental Status: He is alert and oriented to person, place, and time.      ED Treatments / Results  Labs (all labs ordered are listed, but only abnormal results are displayed) Labs Reviewed - No data to display  EKG    Radiology No results found.  Procedures Procedures (including critical care time)  Medications Ordered in ED Medications - No data to display   Initial Impression / Assessment and Plan / ED Course  I have reviewed the triage vital signs and the nursing notes.  Pertinent labs & imaging results that were available during my care of the patient were reviewed by me and considered in my medical decision making (see chart for details).        The abscess appears to be drained and well-healing at this point.  The patient will continue the doxycycline.  I advised him to return for any worsening in the condition.  Patient agrees this plan and all questions were answered.  I did advise he can use some warm bath soaks along with antibacterial soap to keep the area clean and dry.  Final Clinical Impressions(s) / ED Diagnoses   Final diagnoses:  None    ED Discharge Orders    None       Dalia Heading, PA-C 11/09/18 1043    Maudie Flakes, MD 11/10/18 1600

## 2018-11-09 NOTE — Discharge Instructions (Addendum)
Return here for any worsening in your condition.  Follow-up with your primary doctor.  Keep the area clean and dry.  You can also use warm bath soaks to help keep the area clean.

## 2018-12-08 ENCOUNTER — Encounter: Payer: Self-pay | Admitting: Physician Assistant

## 2018-12-20 ENCOUNTER — Encounter: Payer: Self-pay | Admitting: Physician Assistant

## 2018-12-20 ENCOUNTER — Other Ambulatory Visit: Payer: Self-pay

## 2018-12-20 ENCOUNTER — Ambulatory Visit (INDEPENDENT_AMBULATORY_CARE_PROVIDER_SITE_OTHER): Payer: 59 | Admitting: Physician Assistant

## 2018-12-20 VITALS — BP 99/69 | HR 101 | Temp 97.4°F | Resp 16

## 2018-12-20 DIAGNOSIS — R112 Nausea with vomiting, unspecified: Secondary | ICD-10-CM

## 2018-12-20 MED ORDER — ONDANSETRON 8 MG PO TBDP: 8.0000 mg | ORAL_TABLET | Freq: Three times a day (TID) | ORAL | 0 refills | Status: DC | PRN

## 2018-12-20 NOTE — Patient Instructions (Signed)
Instructions sent to MyChart.   Please take the Zofran as directed to help with nausea and vomiting.  Follow the dietary recommendations below so you can resume food intake when you feel ready. The main thing is to keep you hydrated especially since you have gotten a little dehydrated (lower BP) from all of the vomiting. If you are unable to keep things in with the medicine, I want you to go to the ER.  We will keep an eye out for your COVID test results.    Bland Diet A bland diet consists of foods that are often soft and do not have a lot of fat, fiber, or extra seasonings. Foods without fat, fiber, or seasoning are easier for the body to digest. They are also less likely to irritate your mouth, throat, stomach, and other parts of your digestive system. A bland diet is sometimes called a BRAT diet. What is my plan? Your health care provider or food and nutrition specialist (dietitian) may recommend specific changes to your diet to prevent symptoms or to treat your symptoms. These changes may include:  Eating small meals often.  Cooking food until it is soft enough to chew easily.  Chewing your food well.  Drinking fluids slowly.  Not eating foods that are very spicy, sour, or fatty.  Not eating citrus fruits, such as oranges and grapefruit. What do I need to know about this diet?  Eat a variety of foods from the bland diet food list.  Do not follow a bland diet longer than needed.  Ask your health care provider whether you should take vitamins or supplements. What foods can I eat? Grains  Hot cereals, such as cream of wheat. Rice. Bread, crackers, or tortillas made from refined white flour. Vegetables Canned or cooked vegetables. Mashed or boiled potatoes. Fruits  Bananas. Applesauce. Other types of cooked or canned fruit with the skin and seeds removed, such as canned peaches or pears. Meats and other proteins  Scrambled eggs. Creamy peanut butter or other nut butters.  Lean, well-cooked meats, such as chicken or fish. Tofu. Soups or broths. Dairy Low-fat dairy products, such as milk, cottage cheese, or yogurt. Beverages  Water. Herbal tea. Apple juice. Fats and oils Mild salad dressings. Canola or olive oil. Sweets and desserts Pudding. Custard. Fruit gelatin. Ice cream. The items listed above may not be a complete list of recommended foods and beverages. Contact a dietitian for more options. What foods are not recommended? Grains Whole grain breads and cereals. Vegetables Raw vegetables. Fruits Raw fruits, especially citrus, berries, or dried fruits. Dairy Whole fat dairy foods. Beverages Caffeinated drinks. Alcohol. Seasonings and condiments Strongly flavored seasonings or condiments. Hot sauce. Salsa. Other foods Spicy foods. Fried foods. Sour foods, such as pickled or fermented foods. Foods with high sugar content. Foods high in fiber. The items listed above may not be a complete list of foods and beverages to avoid. Contact a dietitian for more information. Summary  A bland diet consists of foods that are often soft and do not have a lot of fat, fiber, or extra seasonings.  Foods without fat, fiber, or seasoning are easier for the body to digest.  Check with your health care provider to see how long you should follow this diet plan. It is not meant to be followed for long periods. This information is not intended to replace advice given to you by your health care provider. Make sure you discuss any questions you have with your health care  provider. Document Released: 08/27/2015 Document Revised: 06/03/2017 Document Reviewed: 06/03/2017 Elsevier Patient Education  2020 [employer].

## 2018-12-20 NOTE — Progress Notes (Signed)
I have discussed the procedure for the virtual visit with the patient who has given consent to proceed with assessment and treatment.   Paul Arnold S [place], CMA     

## 2018-12-20 NOTE — Progress Notes (Signed)
Virtual Visit via Video   I connected with patient on 12/20/18 at 10:30 AM EDT by a video enabled telemedicine application and verified that I am speaking with the correct person using two identifiers.  Location patient: Home Location provider: [facility], [facility] Persons participating in the virtual visit: Patient, Provider, [place] (Patina Moore)  I discussed the limitations of evaluation and management by telemedicine and the availability of in person appointments. The patient expressed understanding and agreed to proceed.  Subjective:   HPI:   Patient presents via Doxy[facility]Me today c/o nausea and vomiting starting last night. Endorses about 7:30 PM noting nausea and then 3 episodes of non-bloody emesis. Notes 2 more episodes this morning. Denies coffee ground emesis (patient with history of gastric ulcer). Denies change to bowel habits. Denies recurrence of GERD. Marland KitchenDenies change to diet or hydration. Denies recent alcohol use. Denies use of NSAID. Denies recent travel. Notes his wife had similar symptoms 2 days ago but with diarrhea which have completely resolved.   ROS:   See pertinent positives and negatives per HPI.  Patient Active Problem List   Diagnosis Date Noted  . GAD (generalized anxiety disorder) 01/06/2018  . Acute appendicitis 04/23/2015    Social History   Tobacco Use  . Smoking status: Former Smoker    Packs/day: 0.50    Years: 10.00    Pack years: 5.00    Types: Cigarettes  . Smokeless tobacco: Never Used  Substance Use Topics  . Alcohol use: No    Current Outpatient Medications:  .  beclomethasone (QVAR) 80 MCG/ACT inhaler, Inhale 1 puff into the lungs 2 (two) times daily. (Patient taking differently: Inhale 1 puff into the lungs 2 (two) times daily as needed (for flares). ), Disp: 10.6 g, Rfl: 0 .  clonazePAM (KLONOPIN) 0.5 MG tablet, Take 1 tablet (0.5 mg total) by mouth 2 (two) times daily as needed., Disp: 60 tablet, Rfl: 0 .  escitalopram  (LEXAPRO) 10 MG tablet, TAKE 1 TABLET BY MOUTH EVERY DAY, Disp: 90 tablet, Rfl: 0 .  famotidine (PEPCID) 20 MG tablet, Take 1 tablet (20 mg total) by mouth every morning., Disp: 30 tablet, Rfl: 3 .  fluticasone (FLONASE) 50 MCG/ACT nasal spray, Place 2 sprays into both nostrils daily as needed for rhinitis (or seasonal allergies). , Disp: , Rfl:  .  ondansetron (ZOFRAN ODT) 8 MG disintegrating tablet, Take 1 tablet (8 mg total) by mouth every 8 (eight) hours as needed for nausea or vomiting., Disp: 50 tablet, Rfl: 0 .  pantoprazole (PROTONIX) 40 MG tablet, Take 1 tablet (40 mg total) by mouth daily., Disp: 30 tablet, Rfl: 3 .  potassium chloride (K-DUR) 10 MEQ tablet, Take 1 tablet (10 mEq total) by mouth 2 (two) times daily., Disp: 10 tablet, Rfl: 0 .  sucralfate (CARAFATE) 1 g tablet, Take 1 tablet (1 g total) by mouth 4 (four) times daily -  with meals and at bedtime., Disp: 40 tablet, Rfl: 0  Allergies  Allergen Reactions  . Contrast Media [Iodinated Diagnostic Agents] Hives and Itching    Itching , hives on 04-23-15 CT exam 05-10-15---pt got 1 hr emergent prep in ED for CT -dosed with Barium--no reaction noted  . Penicillins Other (See Comments)    From childhood- reaction not recalled Has patient had a PCN reaction causing immediate rash, facial/tongue/throat swelling, SOB or lightheadedness with hypotension: Unk Has patient had a PCN reaction causing severe rash involving mucus membranes or skin necrosis: Unk Has patient had a PCN  reaction that required hospitalization: Unk Has patient had a PCN reaction occurring within the last 10 years: No If all of the above answers are "NO", then may proceed with Cephalosporin use.     Objective:   BP 99/69   Pulse (!) 101   Temp (!) 97.4 F (36.3 C) (Oral)   Resp 16   SpO2 99%   Patient is well-developed, well-nourished in no acute distress.  Resting comfortably at home.  Head is normocephalic, atraumatic.  No labored breathing.   Speech is clear and coherent with logical contest.  Patient is alert and oriented at baseline.   Assessment and Plan:   1. Non-intractable vomiting with nausea, unspecified vomiting type Mild. Improved somewhat since earlier. Likely mild gastroenteritis. No alarm signs or symptoms. Mild decreased in BP. He is tolerating oral fluids and encouraged to continue this, keeping close eye on BP to make sure it is returning to normal. Start [employer]. Rx Zofran to take as directed. Strict ER precautions reviewed with patient. Handout sent to MyChart.     [name], PA-C 12/20/2018

## 2019-01-19 ENCOUNTER — Encounter: Payer: Self-pay | Admitting: Physician Assistant

## 2019-01-19 DIAGNOSIS — F411 Generalized anxiety disorder: Secondary | ICD-10-CM

## 2019-01-19 NOTE — Telephone Encounter (Signed)
Clonazepam last rx 10/01/18 #60 [facility]: 05/07/18 UDS: N/A

## 2019-01-20 MED ORDER — ESCITALOPRAM OXALATE 10 MG PO TABS: 10.0000 mg | ORAL_TABLET | Freq: Every day | ORAL | 1 refills | Status: DC

## 2019-01-20 MED ORDER — CLONAZEPAM 0.5 MG PO TABS: 0.5000 mg | ORAL_TABLET | Freq: Two times a day (BID) | ORAL | 0 refills | Status: DC | PRN

## 2019-02-17 ENCOUNTER — Encounter: Payer: Self-pay | Admitting: Physician Assistant

## 2019-02-22 ENCOUNTER — Encounter: Payer: Self-pay | Admitting: Physician Assistant

## 2019-02-22 ENCOUNTER — Ambulatory Visit (INDEPENDENT_AMBULATORY_CARE_PROVIDER_SITE_OTHER): Payer: 59 | Admitting: Physician Assistant

## 2019-02-22 ENCOUNTER — Other Ambulatory Visit: Payer: Self-pay

## 2019-02-22 VITALS — Temp 97.9°F

## 2019-02-22 DIAGNOSIS — R112 Nausea with vomiting, unspecified: Secondary | ICD-10-CM | POA: Diagnosis not present

## 2019-02-22 MED ORDER — PANTOPRAZOLE SODIUM 40 MG PO TBEC: 40.0000 mg | DELAYED_RELEASE_TABLET | Freq: Two times a day (BID) | ORAL | 0 refills | Status: DC

## 2019-02-22 NOTE — Patient Instructions (Signed)
Instructions sent to MyChart

## 2019-02-22 NOTE — Progress Notes (Signed)
I have discussed the procedure for the virtual visit with the patient who has given consent to proceed with assessment and treatment.   Paul Arnold, CMA     

## 2019-02-22 NOTE — Progress Notes (Signed)
Virtual Visit via Video   I connected with patient on 02/22/19 at  3:00 PM EDT by a video enabled telemedicine application and verified that I am speaking with the correct person using two identifiers.  Location patient: Home Location provider: [facility], [facility] Persons participating in the virtual visit: Patient, Provider, [place] (Patina Moore)  I discussed the limitations of evaluation and management by telemedicine and the availability of in person appointments. The patient expressed understanding and agreed to proceed.  Subjective:   HPI:   Patient with history of GERD complicated by gastritis and ulceration, presents via Doxy.me today c/o 1 week of AM episodes of non-bloody emesis occurring just after completion of bowel movement. Notes epigastric discomfort described as soreness of abdominal wall for a few hours afterwards. Denies recurrent episodes throughout the rest of the day. Notes bowel movements are normal. Notes his GERD had increased at night and early morning and PPI once daily is no longer controlling symptoms. Is keeping a very bland diet and avoiding alcohol, NSAIDs, etc. Denies fever, chills. Denies melena, hematochezia or tenesmus. Denies recent travel or sick contact.   ROS:   See pertinent positives and negatives per HPI.  Patient Active Problem List   Diagnosis Date Noted  . GAD (generalized anxiety disorder) 01/06/2018  . Acute appendicitis 04/23/2015    Social History   Tobacco Use  . Smoking status: Former Smoker    Packs/day: 0.50    Years: 10.00    Pack years: 5.00    Types: Cigarettes  . Smokeless tobacco: Never Used  Substance Use Topics  . Alcohol use: No    Current Outpatient Medications:  .  beclomethasone (QVAR) 80 MCG/ACT inhaler, Inhale 1 puff into the lungs 2 (two) times daily. (Patient taking differently: Inhale 1 puff into the lungs 2 (two) times daily as needed (for flares). ), Disp: 10.6 g, Rfl: 0 .  clonazePAM (KLONOPIN)  0.5 MG tablet, Take 1 tablet (0.5 mg total) by mouth 2 (two) times daily as needed., Disp: 60 tablet, Rfl: 0 .  escitalopram (LEXAPRO) 10 MG tablet, Take 1 tablet (10 mg total) by mouth daily., Disp: 90 tablet, Rfl: 1 .  famotidine (PEPCID) 20 MG tablet, Take 1 tablet (20 mg total) by mouth every morning., Disp: 30 tablet, Rfl: 3 .  fluticasone (FLONASE) 50 MCG/ACT nasal spray, Place 2 sprays into both nostrils daily as needed for rhinitis (or seasonal allergies). , Disp: , Rfl:  .  ondansetron (ZOFRAN ODT) 8 MG disintegrating tablet, Take 1 tablet (8 mg total) by mouth every 8 (eight) hours as needed for nausea or vomiting., Disp: 20 tablet, Rfl: 0 .  pantoprazole (PROTONIX) 40 MG tablet, Take 1 tablet (40 mg total) by mouth daily., Disp: 30 tablet, Rfl: 3 .  potassium chloride (K-DUR) 10 MEQ tablet, Take 1 tablet (10 mEq total) by mouth 2 (two) times daily., Disp: 10 tablet, Rfl: 0 .  sucralfate (CARAFATE) 1 g tablet, Take 1 tablet (1 g total) by mouth 4 (four) times daily -  with meals and at bedtime., Disp: 40 tablet, Rfl: 0  Allergies  Allergen Reactions  . Contrast Media [Iodinated Diagnostic Agents] Hives and Itching    Itching , hives on 04-23-15 CT exam 05-10-15---pt got 1 hr emergent prep in ED for CT -dosed with Barium--no reaction noted  . Penicillins Other (See Comments)    From childhood- reaction not recalled Has patient had a PCN reaction causing immediate rash, facial/tongue/throat swelling, SOB or lightheadedness with hypotension:  Unk Has patient had a PCN reaction causing severe rash involving mucus membranes or skin necrosis: Unk Has patient had a PCN reaction that required hospitalization: Unk Has patient had a PCN reaction occurring within the last 10 years: No If all of the above answers are "NO", then may proceed with Cephalosporin use.     Objective:   There were no vitals taken for this visit.  Patient is well-developed, well-nourished in no acute distress.   Resting comfortably at home.  Head is normocephalic, atraumatic.  No labored breathing.  Speech is clear and coherent with logical content.  Patient is alert and oriented at baseline.   Assessment and Plan:      1. Non-intractable vomiting with nausea, unspecified vomiting type Emesis after morning BM. Very odd presentation, wonder if related to significant AM nausea from night time and early AM heart burn. GERD still seems to be uncontrolled despite PPI therapy and H@ blocker. Will increase PPI to BID dosing for now. Will have him come in for labs to further assess. He has scheduled follow-up with his GI provider. New referral placed for insurance purposes per patient request. Strict ER guidelines reviewed.  - Ambulatory referral to Gastroenterology; Future - CBC w/Diff; Future - Comp Met (CMET); Future - Lipase; Future .   [name], PA-C 02/22/2019

## 2019-02-23 ENCOUNTER — Encounter: Payer: Self-pay | Admitting: Physician Assistant

## 2019-02-24 ENCOUNTER — Encounter: Payer: Self-pay | Admitting: Physician Assistant

## 2019-02-25 ENCOUNTER — Encounter: Payer: Self-pay | Admitting: Physician Assistant

## 2019-03-07 ENCOUNTER — Encounter: Payer: Self-pay | Admitting: Gastroenterology

## 2019-03-07 ENCOUNTER — Ambulatory Visit: Payer: 59 | Admitting: Gastroenterology

## 2019-03-07 ENCOUNTER — Telehealth: Payer: Self-pay | Admitting: Physician Assistant

## 2019-03-07 VITALS — BP 98/60 | HR 96 | Temp 98.2°F | Ht 66.0 in | Wt 118.5 lb

## 2019-03-07 DIAGNOSIS — E871 Hypo-osmolality and hyponatremia: Secondary | ICD-10-CM | POA: Diagnosis not present

## 2019-03-07 DIAGNOSIS — E876 Hypokalemia: Secondary | ICD-10-CM

## 2019-03-07 MED ORDER — ONDANSETRON 8 MG PO TBDP: 8.0000 mg | ORAL_TABLET | Freq: Three times a day (TID) | ORAL | 0 refills | Status: DC | PRN

## 2019-03-07 MED ORDER — FAMOTIDINE 20 MG PO TABS: 20.0000 mg | ORAL_TABLET | Freq: Two times a day (BID) | ORAL | 3 refills | Status: AC

## 2019-03-07 MED ORDER — PANTOPRAZOLE SODIUM 40 MG PO TBEC: 40.0000 mg | DELAYED_RELEASE_TABLET | Freq: Two times a day (BID) | ORAL | 3 refills | Status: DC

## 2019-03-07 NOTE — Telephone Encounter (Signed)
[facility] Called Dr. Modena Nunnery wanted to see if Einar Pheasant would do a EKG on the patient before he Dr.Beaver started him on a new medication. Please advise.

## 2019-03-07 NOTE — Progress Notes (Signed)
Referring Provider: Brunetta Jeans, PA-C Primary Care Physician:  Brunetta Jeans, PA-C  Chief complaint:  Abdominal pain and vomiting   IMPRESSION:  Nausea, vomiting, abdominal pain    - CT abd/pelvis with contrast 09/10/18: no acute findings    - normal LFTs and lipase 09/10/18    - suspected cannabinoid hyperemesis syndrome versus cyclic vomiting syndrome Urine tox + THC 09/10/2018 Frequent NSAIDs - ibuprofen and Goody's powder Recent hypokalemia Recent hyponatremia Borderline splenomegaly History of Crohns versus IBS in 1998    - did not follow-up or have formal evaluation performed due to financial limitations at the time  Persistent early morning symptoms despite antiemetics and PPI.  Cannabinoid hyperemesis syndrome may be contributing.  Upper endoscopy showed mild reflux and ruled out other gastric and intestinal pathology.  Recent hypokalemia and hyponatremia need close follow-up, as these may be contributing to his persistent symptoms despite abstinence from marijuana.   Given the frequency of symptoms, I recommend tricyclic antidepressants for prophylaxis. I recommend amitriptyline 38m QHS, titrated up by 25 mg every 2 to 3 weeks to minimize emergence of side effects. Prior to starting therapy, he will need an EKG to monitor his QT interval.  I will ask PA MHassell Donefor his assistance with obtaining this EKG.  We discussed the possible, and frequently common, side effects of cognitive impairment, drowsiness, dryness of mouth, weight gain, constipation, and mood changes.   I have asked the patient to review anxiety and depression with the primary care provider.  Abstaining from heavy cannabis use is important to achieve good outcomes.   PLAN: Ondansetron 8 mg SL #50 Continue protonix 40 mg twice daily Increase famotidine to 20 mg BID given persistent symptoms  Continue to abstain from marijuana Continue to avoid all NSAIDs BMP, CBC to follow-up on his hypokalemia and  hyponatremia EKG with Mr. 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  When we initially met during a telehealth consultation 09/16/18 abdominal pain, nausea, and vomiting.  He had an upper endoscopy performed 10/21/2018 showing gastritis.  Esophageal biopsies showed mild reflux esophagitis.  Esophageal biopsies were negative for eosinophilic esophagitis, gastric biopsies were negative for H. pylori, and duodenal biopsies were normal.   Continues to awake with nausea that persists during the first 3 hours of each day.  He frequently experiences 3 episodes of vomiting during this time. Vomiting usually occurs after his morning bowel movement.  Symptoms resolve by lunch when he is at work. His wife reports that its coffee grounds or like sand.  She is concerned there could be blood in his emesis.  Increased pantoprazole to 40 mg BID and famotidine 20 mg. Needing Tums for breakthrough symptoms daily. Using Zofran prior to eating in the morning although this doesn't consistently help.  Continues to use Bentyl.  Continues to take 2-3 hot showers daily as this provides the best relief. He purchased a hot tub and that has provided additional relief.    Adjusting his false teeth has not made any changes.   He quit smoking cigarettes. Has cut back on marijuana. He has changed his diet: eating less meat. He finds that his wish to vomit increases the more vegetables that he eats.   His wife is concerned because he has lost 10 pounds since the symptoms started.   There are no new  complaints or concerns.  Most recent labs: 09/10/2018: Normal CMP except for a sodium of 133, potassium 3.0, calcium 8.7.  CBC normal except for RDW of 11.3.  Platelets are moderately low at 183.  Endoscopic history EGD 10/21/2018: Gastritis. Esophageal biopsies showed mild reflux  esophagitis.  Esophageal biopsies were negative for eosinophilic esophagitis, gastric biopsies were negative for H. pylori, and duodenal biopsies were normal.   Abdominal imaging CT of the abdomen and pelvis with contrast 05/10/2015: No acute findings CT of the abdomen and pelvis with contrast 09/10/2018: No acute findings    Past Medical History:  Diagnosis Date  . Appendicitis   . Asthma   . Crohn disease ([place])   . Periodontitis     Past Surgical History:  Procedure Laterality Date  . APPENDECTOMY  04/23/2015   laproscopic   . LAPAROSCOPIC APPENDECTOMY N/A 04/23/2015   Procedure: APPENDECTOMY LAPAROSCOPIC;  Surgeon: Judeth Horn, MD;  Location: [facility];  Service: General;  Laterality: N/A;    Current Outpatient Medications  Medication Sig Dispense Refill  . beclomethasone (QVAR) 80 MCG/ACT inhaler Inhale 1 puff into the lungs 2 (two) times daily. (Patient taking differently: Inhale 1 puff into the lungs 2 (two) times daily as needed (for flares). ) 10.6 g 0  . clonazePAM (KLONOPIN) 0.5 MG tablet Take 1 tablet (0.5 mg total) by mouth 2 (two) times daily as needed. 60 tablet 0  . escitalopram (LEXAPRO) 10 MG tablet Take 1 tablet (10 mg total) by mouth daily. 90 tablet 1  . famotidine (PEPCID) 20 MG tablet Take 1 tablet (20 mg total) by mouth every morning. 30 tablet 3  . fluticasone (FLONASE) 50 MCG/ACT nasal spray Place 2 sprays into both nostrils daily as needed for rhinitis (or seasonal allergies).     . ondansetron (ZOFRAN ODT) 8 MG disintegrating tablet Take 1 tablet (8 mg total) by mouth every 8 (eight) hours as needed for nausea or vomiting. 20 tablet 0  . pantoprazole (PROTONIX) 40 MG tablet Take 1 tablet (40 mg total) by mouth 2 (two) times daily. 30 tablet 0  . potassium chloride (K-DUR) 10 MEQ tablet Take 1 tablet (10 mEq total) by mouth 2 (two) times daily. 10 tablet 0   No current facility-administered medications for this visit.     Allergies as of 03/07/2019 - Review  Complete 02/22/2019  Allergen Reaction Noted  . Contrast media [iodinated diagnostic agents] Hives and Itching 05/01/2015  . Penicillins Other (See Comments) 04/23/2015    Family History  Problem Relation Age of Onset  . Healthy Mother   . Breast cancer Mother   . Ovarian cancer Mother   . Colon cancer Neg Hx   . Esophageal cancer Neg Hx   . Rectal cancer Neg Hx   . Stomach cancer Neg Hx     Social History   Socioeconomic History  . Marital status: Married    Spouse name: Not on file  . Number of children: 2  . Years of education: Not on file  . Highest education level: Not on file  Occupational History  . Occupation: Agricultural consultant  Social Needs  . Financial resource strain: Not on file  . Food insecurity    Worry: Not on file    Inability: Not on file  . Transportation needs    Medical: Not on file    Non-medical: Not on file  Tobacco Use  . Smoking status: Former Smoker    Packs/day: 0.50    Years: 10.00  Pack years: 5.00    Types: Cigarettes  . Smokeless tobacco: Never Used  Substance and Sexual Activity  . Alcohol use: No  . Drug use: Yes    Types: Marijuana  . Sexual activity: Yes  Lifestyle  . Physical activity    Days per week: Not on file    Minutes per session: Not on file  . Stress: Not on file  Relationships  . Social Herbalist on phone: Not on file    Gets together: Not on file    Attends religious service: Not on file    Active member of club or organization: Not on file    Attends meetings of clubs or organizations: Not on file    Relationship status: Not on file  . Intimate partner violence    Fear of current or ex partner: Not on file    Emotionally abused: Not on file    Physically abused: Not on file    Forced sexual activity: Not on file  Other Topics Concern  . Not on file  Social History Narrative  . Not on file    Review of Systems: ALL ROS discussed and all others negative except listed in HPI.  Physical Exam:  General: in no acute distress GI: abdomen soft, nontender, nondistended. No rebound or guarding. Normal bowel sounds.  Neuro: Alert and appropriate Psych: Normal affect and normal insight   Joella Saefong L. Tarri Glenn, MD, MPH [facility] 03/07/2019, 9:50 AM

## 2019-03-07 NOTE — Telephone Encounter (Signed)
Please advise about scheduling an EKG. Review GI note

## 2019-03-07 NOTE — Telephone Encounter (Signed)
I am happy to do EKG but my recommendation would be to transition him from the Lexapro to the Amitriptyline at a comparable dose (can do cross-taper) versus adding them on together. Was there a certain dose the GI provider was wanting to start him on?

## 2019-03-07 NOTE — Patient Instructions (Addendum)
Continue to take your pantoprazole twice daily and famotidine twice daily.   Use the Zofran as needed for nausea.  Please go to the lab for further testing today.  You will need an EKG before we can start a new medication for cyclic vomiting syndrome. The medication that I recommend is amitriptyline.  Let's plan to check in after you've been on the amitriptyline for 2-3 months.  Congratulations on not smoking cigarettes! This will make a big difference in your health over time.   Marijuana may contribute to your nausea and vomiting. I strongly recommend abstinence in the event this is causing or worsening your symptoms.   Call with any questions or concerns in the meantime.

## 2019-03-08 NOTE — Telephone Encounter (Signed)
See staff message regarding scheduling patient for EKG and discussion of medication.

## 2019-03-09 NOTE — Telephone Encounter (Signed)
Spoke with patient about needing an appointment for EKG per GI to start Amitriptyline for vomiting. Patient scheduled for Friday at 10 am.

## 2019-03-11 ENCOUNTER — Other Ambulatory Visit: Payer: Self-pay

## 2019-03-11 ENCOUNTER — Encounter: Payer: Self-pay | Admitting: Physician Assistant

## 2019-03-11 ENCOUNTER — Ambulatory Visit: Payer: 59 | Admitting: Physician Assistant

## 2019-03-11 VITALS — BP 102/64 | HR 67 | Temp 98.9°F | Resp 14 | Ht 66.0 in | Wt 119.0 lb

## 2019-03-11 DIAGNOSIS — R1115 Cyclical vomiting syndrome unrelated to migraine: Secondary | ICD-10-CM

## 2019-03-11 MED ORDER — AMITRIPTYLINE HCL 25 MG PO TABS: 25.0000 mg | ORAL_TABLET | Freq: Every day | ORAL | 1 refills | Status: DC

## 2019-03-11 NOTE — Patient Instructions (Signed)
Please keep hydrated. Keep on chronic medications with following exception: -- cut your Lexapro down to 5 mg (1/2) tablet once daily for 1 week before stopping -- we will start the amitriptyline at 25 mg per GI recommendations. We will want you to follow-up with Dr. Tarri Glenn for further script and dose adjustments.   Hang in there!

## 2019-03-11 NOTE — Progress Notes (Signed)
Patient presents to clinic today for EKG prior to initiating Amitriptyline for cyclical vomiting -- treatment plan from GI (Dr. Tarri Glenn). Patient without history of arrhythmia. Is currently on low-dose Lexapro (10 mg) for anxiety and depression but no other potential QT prolonging agents. Patient denies chest pain, palpitations, lightheadedness, dizziness, vision changes or frequent headaches.  Past Medical History:  Diagnosis Date  . Appendicitis   . Asthma   . Crohn disease ([place])   . Periodontitis     Current Outpatient Medications on File Prior to Visit  Medication Sig Dispense Refill  . beclomethasone (QVAR) 80 MCG/ACT inhaler Inhale 1 puff into the lungs 2 (two) times daily. (Patient taking differently: Inhale 1 puff into the lungs 2 (two) times daily as needed (for flares). ) 10.6 g 0  . clonazePAM (KLONOPIN) 0.5 MG tablet Take 1 tablet (0.5 mg total) by mouth 2 (two) times daily as needed. 60 tablet 0  . escitalopram (LEXAPRO) 10 MG tablet Take 1 tablet (10 mg total) by mouth daily. 90 tablet 1  . famotidine (PEPCID) 20 MG tablet Take 1 tablet (20 mg total) by mouth 2 (two) times daily. 60 tablet 3  . fluticasone (FLONASE) 50 MCG/ACT nasal spray Place 2 sprays into both nostrils daily as needed for rhinitis (or seasonal allergies).     . ondansetron (ZOFRAN ODT) 8 MG disintegrating tablet Take 1 tablet (8 mg total) by mouth every 8 (eight) hours as needed for nausea or vomiting. 50 tablet 0  . pantoprazole (PROTONIX) 40 MG tablet Take 1 tablet (40 mg total) by mouth 2 (two) times daily. 60 tablet 3  . potassium chloride (K-DUR) 10 MEQ tablet Take 1 tablet (10 mEq total) by mouth 2 (two) times daily. 10 tablet 0   No current facility-administered medications on file prior to visit.     Allergies  Allergen Reactions  . Contrast Media [Iodinated Diagnostic Agents] Hives and Itching    Itching , hives on 04-23-15 CT exam 05-10-15---pt got 1 hr emergent prep in ED for CT -dosed with  Barium--no reaction noted  . Penicillins Other (See Comments)    From childhood- reaction not recalled Has patient had a PCN reaction causing immediate rash, facial/tongue/throat swelling, SOB or lightheadedness with hypotension: Unk Has patient had a PCN reaction causing severe rash involving mucus membranes or skin necrosis: Unk Has patient had a PCN reaction that required hospitalization: Unk Has patient had a PCN reaction occurring within the last 10 years: No If all of the above answers are "NO", then may proceed with Cephalosporin use.     Family History  Problem Relation Age of Onset  . Healthy Mother   . Breast cancer Mother   . Ovarian cancer Mother   . Colon cancer Neg Hx   . Esophageal cancer Neg Hx   . Rectal cancer Neg Hx   . Stomach cancer Neg Hx     Social History   Socioeconomic History  . Marital status: Married    Spouse name: Not on file  . Number of children: 2  . Years of education: Not on file  . Highest education level: Not on file  Occupational History  . Occupation: Agricultural consultant  Social Needs  . Financial resource strain: Not on file  . Food insecurity    Worry: Not on file    Inability: Not on file  . Transportation needs    Medical: Not on file    Non-medical: Not on file  Tobacco Use  .  Smoking status: Former Smoker    Packs/day: 0.50    Years: 10.00    Pack years: 5.00    Types: Cigarettes  . Smokeless tobacco: Never Used  Substance and Sexual Activity  . Alcohol use: No  . Drug use: Yes    Types: Marijuana  . Sexual activity: Yes  Lifestyle  . Physical activity    Days per week: Not on file    Minutes per session: Not on file  . Stress: Not on file  Relationships  . Social Herbalist on phone: Not on file    Gets together: Not on file    Attends religious service: Not on file    Active member of club or organization: Not on file    Attends meetings of clubs or organizations: Not on file    Relationship status: Not  on file  Other Topics Concern  . Not on file  Social History Narrative  . Not on file   Review of Systems - See HPI.  All other ROS are negative.  BP 102/64   Pulse 67   Temp 98.9 F (37.2 C) (Temporal)   Resp 14   Ht 5' 6"  (1.676 m)   Wt 119 lb (54 kg)   SpO2 98%   BMI 19.21 kg/m   Physical Exam Vitals signs reviewed.  Constitutional:      Appearance: Normal appearance.  HENT:     Head: Normocephalic and atraumatic.     Right Ear: Tympanic membrane normal.     Left Ear: Tympanic membrane normal.  Neck:     Musculoskeletal: Neck supple.  Cardiovascular:     Rate and Rhythm: Normal rate and regular rhythm.     Pulses: Normal pulses.     Heart sounds: Normal heart sounds.  Pulmonary:     Effort: Pulmonary effort is normal.  Neurological:     Mental Status: He is alert.  Psychiatric:        Mood and Affect: Mood normal.    Assessment/Plan: 1. Cyclical vomiting EKG stable. QT interval at 410. We are having him wean off of the Lexapro -- taking 5 mg daily for 1 week before stopping. Will start Amitriptyline at 25 mg QHS per GI guidance. He is to follow-up with Dr. Tarri Glenn for reassessment of cyclical vomiting and to further titrate medication dose.  - EKG 12-Lead - amitriptyline (ELAVIL) 25 MG tablet; Take 1 tablet (25 mg total) by mouth at bedtime.  Dispense: 30 tablet; Refill: [place], [place]

## 2019-03-14 ENCOUNTER — Encounter: Payer: Self-pay | Admitting: Physician Assistant

## 2019-03-15 ENCOUNTER — Encounter: Payer: Self-pay | Admitting: Physician Assistant

## 2019-03-15 ENCOUNTER — Other Ambulatory Visit: Payer: Self-pay

## 2019-03-15 ENCOUNTER — Ambulatory Visit (INDEPENDENT_AMBULATORY_CARE_PROVIDER_SITE_OTHER): Payer: 59 | Admitting: Physician Assistant

## 2019-03-15 VITALS — Temp 98.4°F

## 2019-03-15 DIAGNOSIS — R21 Rash and other nonspecific skin eruption: Secondary | ICD-10-CM | POA: Diagnosis not present

## 2019-03-15 MED ORDER — VALACYCLOVIR HCL 1 G PO TABS: 1000.0000 mg | ORAL_TABLET | Freq: Three times a day (TID) | ORAL | 0 refills | Status: DC

## 2019-03-15 NOTE — Patient Instructions (Signed)
Instructions sent to MyChart.   Please avoid scratching the area.  Keep skin clean and dry. You can apply topical OTC lidocaine or hydrocortisone cream to the areas if needed. Take the Valtrex as directed as I am concerned, especially giving tingling and itching that this is start of a shingles rash.  Please keep covered.  Update me on any new or worsening symptoms.  Please send updated picture of rash tomorrow.    Shingles  Shingles is an infection. It gives you a painful skin rash and blisters that have fluid in them. Shingles is caused by the same germ (virus) that causes chickenpox. Shingles only happens in people who:  Have had chickenpox.  Have been given a shot of medicine (vaccine) to protect against chickenpox. Shingles is rare in this group. The first symptoms of shingles may be itching, tingling, or pain in an area on your skin. A rash will show on your skin a few days or weeks later. The rash is likely to be on one side of your body. The rash usually has a shape like a belt or a band. Over time, the rash turns into fluid-filled blisters. The blisters will break open, change into scabs, and dry up. Medicines may:  Help with pain and itching.  Help you get better sooner.  Help to prevent long-term problems. Follow these instructions at home: Medicines  Take over-the-counter and prescription medicines only as told by your doctor.  Put on an anti-itch cream or numbing cream where you have a rash, blisters, or scabs. Do this as told by your doctor. Helping with itching and discomfort   Put cold, wet cloths (cold compresses) on the area of the rash or blisters as told by your doctor.  Cool baths can help you feel better. Try adding baking soda or dry oatmeal to the water to lessen itching. Do not bathe in hot water. Blister and rash care  Keep your rash covered with a loose bandage (dressing).  Wear loose clothing that does not rub on your rash.  Keep your rash and  blisters clean. To do this, wash the area with mild soap and cool water as told by your doctor.  Check your rash every day for signs of infection. Check for: ? More redness, swelling, or pain. ? Fluid or blood. ? Warmth. ? Pus or a bad smell.  Do not scratch your rash. Do not pick at your blisters. To help you to not scratch: ? Keep your fingernails clean and cut short. ? Wear gloves or mittens when you sleep, if scratching is a problem. General instructions  Rest as told by your doctor.  Keep all follow-up visits as told by your doctor. This is important.  Wash your hands often with soap and water. If soap and water are not available, use hand sanitizer. Doing this lowers your chance of getting a skin infection caused by germs (bacteria).  Your infection can cause chickenpox in people who have never had chickenpox or never got a shot of chickenpox vaccine. If you have blisters that did not change into scabs yet, try not to touch other people or be around other people, especially: ? Babies. ? Pregnant women. ? Children who have areas of red, itchy, or rough skin (eczema). ? Very old people who have transplants. ? People who have a long-term (chronic) sickness, like cancer or AIDS. Contact a doctor if:  Your pain does not get better with medicine.  Your pain does not get better after  the rash heals.  You have any signs of infection in the rash area. These signs include: ? More redness, swelling, or pain around the rash. ? Fluid or blood coming from the rash. ? The rash area feeling warm to the touch. ? Pus or a bad smell coming from the rash. Get help right away if:  The rash is on your face or nose.  You have pain in your face or pain by your eye.  You lose feeling on one side of your face.  You have trouble seeing.  You have ear pain, or you have ringing in your ear.  You have a loss of taste.  Your condition gets worse. Summary  Shingles gives you a painful skin  rash and blisters that have fluid in them.  Shingles is an infection. It is caused by the same germ (virus) that causes chickenpox.  Keep your rash covered with a loose bandage (dressing). Wear loose clothing that does not rub on your rash.  If you have blisters that did not change into scabs yet, try not to touch other people or be around people. This information is not intended to replace advice given to you by your health care provider. Make sure you discuss any questions you have with your health care provider. Document Released: 10/22/2007 Document Revised: 08/27/2018 Document Reviewed: 01/07/2017 Elsevier Patient Education  2020 [employer].

## 2019-03-15 NOTE — Progress Notes (Signed)
I have discussed the procedure for the virtual visit with the patient who has given consent to proceed with assessment and treatment.   Diago Haik S Jatoria Kneeland, CMA     

## 2019-03-15 NOTE — Progress Notes (Signed)
Virtual Visit via Video   I connected with patient on 03/15/19 at 10:00 AM EDT by a video enabled telemedicine application and verified that I am speaking with the correct person using two identifiers.  Location patient: Home Location provider: [facility], Office Persons participating in the virtual visit: Patient, Provider, [place] (Patina Moore)  I discussed the limitations of evaluation and management by telemedicine and the availability of in person appointments. The patient expressed understanding and agreed to proceed.  Subjective:   HPI:   Patient presents via Doxy.Me for c/o lump on left lower leg. First noticed it last night, size has decreased since then. No injury or trauma to the area. Located in posterior left calf with red discoloration to shin area. Sharp pain when he applies pressure to the area. Area is very sensitive. Mild discomfort with walking, no pain at rest. Endorses some pruritus over the shin area. Has not tried any topical or oral remedies. Does not recall getting bit by anything. Denies fever or chills, no drainage. No swelling in knee or ankle. No decreased ROM.   ROS:   See pertinent positives and negatives per HPI.  Patient Active Problem List   Diagnosis Date Noted  . GAD (generalized anxiety disorder) 01/06/2018  . Acute appendicitis 04/23/2015    Social History   Tobacco Use  . Smoking status: Former Smoker    Packs/day: 0.50    Years: 10.00    Pack years: 5.00    Types: Cigarettes  . Smokeless tobacco: Never Used  Substance Use Topics  . Alcohol use: No    Current Outpatient Medications:  .  amitriptyline (ELAVIL) 25 MG tablet, Take 1 tablet (25 mg total) by mouth at bedtime., Disp: 30 tablet, Rfl: 1 .  beclomethasone (QVAR) 80 MCG/ACT inhaler, Inhale 1 puff into the lungs 2 (two) times daily. (Patient taking differently: Inhale 1 puff into the lungs 2 (two) times daily as needed (for flares). ), Disp: 10.6 g, Rfl: 0 .  clonazePAM  (KLONOPIN) 0.5 MG tablet, Take 1 tablet (0.5 mg total) by mouth 2 (two) times daily as needed., Disp: 60 tablet, Rfl: 0 .  famotidine (PEPCID) 20 MG tablet, Take 1 tablet (20 mg total) by mouth 2 (two) times daily., Disp: 60 tablet, Rfl: 3 .  fluticasone (FLONASE) 50 MCG/ACT nasal spray, Place 2 sprays into both nostrils daily as needed for rhinitis (or seasonal allergies). , Disp: , Rfl:  .  ondansetron (ZOFRAN ODT) 8 MG disintegrating tablet, Take 1 tablet (8 mg total) by mouth every 8 (eight) hours as needed for nausea or vomiting., Disp: 50 tablet, Rfl: 0 .  pantoprazole (PROTONIX) 40 MG tablet, Take 1 tablet (40 mg total) by mouth 2 (two) times daily., Disp: 60 tablet, Rfl: 3 .  potassium chloride (K-DUR) 10 MEQ tablet, Take 1 tablet (10 mEq total) by mouth 2 (two) times daily., Disp: 10 tablet, Rfl: 0  Allergies  Allergen Reactions  . Contrast Media [Iodinated Diagnostic Agents] Hives and Itching    Itching , hives on 04-23-15 CT exam 05-10-15---pt got 1 hr emergent prep in ED for CT -dosed with Barium--no reaction noted  . Penicillins Other (See Comments)    From childhood- reaction not recalled Has patient had a PCN reaction causing immediate rash, facial/tongue/throat swelling, SOB or lightheadedness with hypotension: Unk Has patient had a PCN reaction causing severe rash involving mucus membranes or skin necrosis: Unk Has patient had a PCN reaction that required hospitalization: Unk Has patient had a  PCN reaction occurring within the last 10 years: No If all of the above answers are "NO", then may proceed with Cephalosporin use.     Objective:   Temp 98.4 F (36.9 C) (Temporal)   Patient is well-developed, well-nourished in no acute distress.  No labored breathing.  Speech is clear and coherent with logical content.  Patient is alert and oriented at baseline.  Unable to see rash on video but patient sent pictures of rash, attached below.      Assessment and Plan:   1.  Rash Unclear etiology. Rash not very impressive on pictures but giving itching, tingling and sensitivity of this and surrounding area, concern for start of shingles rash. Rx Valtrex. Supportive measures and OTC creams reviewed. Strict follow-up discussed.  - valACYclovir (VALTREX) 1000 MG tablet; Take 1 tablet (1,000 mg total) by mouth 3 (three) times daily.  Dispense: 21 tablet; Refill: 0    [name], [place] 03/15/2019

## 2019-03-15 NOTE — Telephone Encounter (Signed)
Call patient to schedule visit -- can be video visit to start with

## 2019-04-04 ENCOUNTER — Encounter: Payer: Self-pay | Admitting: Physician Assistant

## 2019-04-05 ENCOUNTER — Other Ambulatory Visit: Payer: Self-pay | Admitting: Physician Assistant

## 2019-04-05 DIAGNOSIS — R1115 Cyclical vomiting syndrome unrelated to migraine: Secondary | ICD-10-CM

## 2019-04-26 ENCOUNTER — Ambulatory Visit: Payer: 59 | Admitting: Physician Assistant

## 2019-04-27 ENCOUNTER — Ambulatory Visit (INDEPENDENT_AMBULATORY_CARE_PROVIDER_SITE_OTHER): Payer: 59 | Admitting: Physician Assistant

## 2019-04-27 ENCOUNTER — Encounter: Payer: Self-pay | Admitting: Physician Assistant

## 2019-04-27 ENCOUNTER — Other Ambulatory Visit: Payer: Self-pay

## 2019-04-27 VITALS — Temp 98.7°F

## 2019-04-27 DIAGNOSIS — F5101 Primary insomnia: Secondary | ICD-10-CM | POA: Diagnosis not present

## 2019-04-27 DIAGNOSIS — K5900 Constipation, unspecified: Secondary | ICD-10-CM | POA: Diagnosis not present

## 2019-04-27 DIAGNOSIS — F411 Generalized anxiety disorder: Secondary | ICD-10-CM

## 2019-04-27 DIAGNOSIS — J069 Acute upper respiratory infection, unspecified: Secondary | ICD-10-CM

## 2019-04-27 NOTE — Patient Instructions (Signed)
Instructions sent to MyChart.   Please keep well-hydrated and get plenty of rest.  Continue the Xyzal and Flonase. Start a saline nasal rinse 1-2 x daily. Use OTC Mucinex for any chest congestion. Place a humidifier in the bedroom. If there are any worsening symptoms I want you to let us know and we will send you for COVID testing.  Keep quarantined until symptoms are resolving.   I encourage you to increase hydration and the amount of fiber in your diet.  Start a daily probiotic (Align, Culturelle, Digestive Advantage, etc.). If no bowel movement within 24 hours, take 2 Tbs of Milk of Magnesia in a 4 oz glass of warmed prune juice every 2-3 days to help promote bowel movement. If no results within 24 hours, then repeat above regimen, adding a Dulcolax stool softener to regimen. If this does not promote a bowel movement, please call the office.  Sleep Hygiene  Do: (1) Go to bed at the same time each day. (2) Get up from bed at the same time each day. (3) Get regular exercise each day, preferably in the morning.  There is goof evidence that regular exercise improves restful sleep.  This includes stretching and aerobic exercise. (4) Get regular exposure to outdoor or bright lights, especially in the late afternoon. (5) Keep the temperature in your bedroom comfortable. (6) Keep the bedroom quiet when sleeping. (7) Keep the bedroom dark enough to facilitate sleep. (8) Use your bed only for sleep and sex. (9) Take medications as directed.  It is helpful to take prescribed sleeping pills 1 hour before bedtime, so they are causing drowsiness when you lie down, or 10 hours before getting up, to avoid daytime drowsiness. (10) Use a relaxation exercise just before going to sleep -- imagery, massage, warm bath. (11) Keep your feet and hands warm.  Wear warm socks and/or mittens or gloves to bed.  Don't: (1) Exercise just before going to bed. (2) Engage in stimulating activity just before bed, such  as playing a competitive game, watching an exciting program on television, or having an important discussion with a loved one. (3) Have caffeine in the evening (coffee, teas, chocolate, sodas, etc.) (4) Read or watch television in bed. (5) Use alcohol to help you sleep. (6) Go to bed too hungry or too full. (7) Take another person's sleeping pills. (8) Take over-the-counter sleeping pills, without your doctor's knowledge.  Tolerance can develop rapidly with these medications.  Diphenhydramine can have serious side effects for elderly patients. (9) Take daytime naps. (10) Command yourself to go to sleep.  This only makes your mind and body more alert.  If you lie awake for more than 20-30 minutes, get up, go to a different room, participate in a quiet activity (Ex - non-excitable reading or television), and then return to bed when you feel sleepy.  Do this as many times during the night as needed.  This may cause you to have a night or two of poor sleep but it will train your brain to know when it is time for sleep.

## 2019-04-27 NOTE — Progress Notes (Signed)
Virtual Visit via Video   I connected with patient on 04/27/19 at  8:00 AM EST by a video enabled telemedicine application and verified that I am speaking with the correct person using two identifiers.  Location patient: Home Location provider: [facility], Office Persons participating in the virtual visit: Patient, Provider, [place] (Patina Moore)  I discussed the limitations of evaluation and management by telemedicine and the availability of in person appointments. The patient expressed understanding and agreed to proceed.  Subjective:   HPI:  Patient presents via Doxy.Me today to discuss multiple concerns.  Patient endorses over the past week or so having issue with insomnia.  Patient endorses problems with sleep onset and sleep maintenance. Endorses trying to go to bed at 10 o' clock every night. Will lay there until 12 or 1 in the morning before being able to fall asleep. Once asleep notes that he wakes up several times throughout the night, averaging 3-4 hours of interrupted sleep. Notes fatigue because of this. Denies increase in stress or anxiety. Denies pain keeping him from sleep. Feels a little restless. Denies change to diet, hydration or activity level.  Patient also endorses intermittent constipation. Will note some occasional pain in lower back with bowel movement. There and gone. Notes some straining. Denies nocturia. Endorses good bladder emptying. Denies tenesmus, melena or hematochezia. Is trying to hydrate more now.  Patient endorses 2 days of nasal congestion with PND and sinus pressure. Endorses some tinnitus of ears. Denies ar pressure or pain. Denies dizziness.  Denies sore throat. Denies fever. Denies chills or body aches. Some chest congestion and mild cough. Denies recent travel or sick contact. Co-worker daughter with COVID. Coworker did not get COVID and has been out of work. Has taken Xyzal and Flonase which has helped some.   ROS:   See pertinent  positives and negatives per HPI.  Patient Active Problem List   Diagnosis Date Noted  . GAD (generalized anxiety disorder) 01/06/2018  . Acute appendicitis 04/23/2015    Social History   Tobacco Use  . Smoking status: Former Smoker    Packs/day: 0.50    Years: 10.00    Pack years: 5.00    Types: Cigarettes  . Smokeless tobacco: Never Used  Substance Use Topics  . Alcohol use: No    Current Outpatient Medications:  .  amitriptyline (ELAVIL) 25 MG tablet, TAKE 1 TABLET BY MOUTH EVERYDAY AT BEDTIME, Disp: 90 tablet, Rfl: 0 .  beclomethasone (QVAR) 80 MCG/ACT inhaler, Inhale 1 puff into the lungs 2 (two) times daily. (Patient taking differently: Inhale 1 puff into the lungs 2 (two) times daily as needed (for flares). ), Disp: 10.6 g, Rfl: 0 .  clonazePAM (KLONOPIN) 0.5 MG tablet, Take 1 tablet (0.5 mg total) by mouth 2 (two) times daily as needed., Disp: 60 tablet, Rfl: 0 .  famotidine (PEPCID) 20 MG tablet, Take 1 tablet (20 mg total) by mouth 2 (two) times daily., Disp: 60 tablet, Rfl: 3 .  fluticasone (FLONASE) 50 MCG/ACT nasal spray, Place 2 sprays into both nostrils daily as needed for rhinitis (or seasonal allergies). , Disp: , Rfl:  .  ondansetron (ZOFRAN ODT) 8 MG disintegrating tablet, Take 1 tablet (8 mg total) by mouth every 8 (eight) hours as needed for nausea or vomiting., Disp: 50 tablet, Rfl: 0 .  pantoprazole (PROTONIX) 40 MG tablet, Take 1 tablet (40 mg total) by mouth 2 (two) times daily., Disp: 60 tablet, Rfl: 3 .  potassium chloride (K-DUR) 10  MEQ tablet, Take 1 tablet (10 mEq total) by mouth 2 (two) times daily., Disp: 10 tablet, Rfl: 0 .  valACYclovir (VALTREX) 1000 MG tablet, Take 1 tablet (1,000 mg total) by mouth 3 (three) times daily., Disp: 21 tablet, Rfl: 0  Allergies  Allergen Reactions  . Contrast Media [Iodinated Diagnostic Agents] Hives and Itching    Itching , hives on 04-23-15 CT exam 05-10-15---pt got 1 hr emergent prep in ED for CT -dosed with  Barium--no reaction noted  . Penicillins Other (See Comments)    From childhood- reaction not recalled Has patient had a PCN reaction causing immediate rash, facial/tongue/throat swelling, SOB or lightheadedness with hypotension: Unk Has patient had a PCN reaction causing severe rash involving mucus membranes or skin necrosis: Unk Has patient had a PCN reaction that required hospitalization: Unk Has patient had a PCN reaction occurring within the last 10 years: No If all of the above answers are "NO", then may proceed with Cephalosporin use.     Objective:   Temp 98.7 F (37.1 C) (Temporal)   Patient is well-developed, well-nourished in no acute distress.  Resting comfortably at home.  Head is normocephalic, atraumatic.  No labored breathing.  Speech is clear and coherent with logical content.  Patient is alert and oriented at baseline.   Assessment and Plan:   1. Viral URI with cough Very mild. Could potentially be allergic inflammation versus seasonal viral URI. Does not seem COVID-related. Reviewed need for COVID testing if symptoms worsen or new symptoms develop. Either way he is to quarantine until feeling better. Increase fluids and get plenty of rest. Continue Xyzal and Flonase. Start Mucinex OTC. Supportive measures and other OTC medications reviewed.   2. Primary insomnia Reviewed sleep hygiene practices with patient. Start Pure ZZZs nightly. If not improving with these measures can consider RX medication.   3. Constipation, unspecified constipation type Bowel regimen reviewed with patient. Follow-up in office if not improving, as long as URI symptoms have resolved.   Instructions written and sent to patient's MyChart.  [name], PA-C 04/27/2019

## 2019-04-27 NOTE — Progress Notes (Signed)
I have discussed the procedure for the virtual visit with the patient who has given consent to proceed with assessment and treatment.   Paul Arnold, CMA     

## 2019-05-03 MED ORDER — CLONAZEPAM 0.5 MG PO TABS: 0.5000 mg | ORAL_TABLET | Freq: Two times a day (BID) | ORAL | 0 refills | Status: DC | PRN

## 2019-05-23 ENCOUNTER — Encounter: Payer: Self-pay | Admitting: Physician Assistant

## 2019-06-13 ENCOUNTER — Encounter: Payer: Self-pay | Admitting: Physician Assistant

## 2019-06-13 ENCOUNTER — Ambulatory Visit (INDEPENDENT_AMBULATORY_CARE_PROVIDER_SITE_OTHER): Payer: 59 | Admitting: Physician Assistant

## 2019-06-13 ENCOUNTER — Other Ambulatory Visit: Payer: Self-pay

## 2019-06-13 VITALS — Temp 97.9°F

## 2019-06-13 DIAGNOSIS — H6121 Impacted cerumen, right ear: Secondary | ICD-10-CM

## 2019-06-13 DIAGNOSIS — H6981 Other specified disorders of Eustachian tube, right ear: Secondary | ICD-10-CM | POA: Diagnosis not present

## 2019-06-13 DIAGNOSIS — B9789 Other viral agents as the cause of diseases classified elsewhere: Secondary | ICD-10-CM

## 2019-06-13 DIAGNOSIS — J329 Chronic sinusitis, unspecified: Secondary | ICD-10-CM

## 2019-06-13 NOTE — Progress Notes (Signed)
I have discussed the procedure for the virtual visit with the patient who has given consent to proceed with assessment and treatment.   Paul Arnold S Paul Arnold, CMA     

## 2019-06-13 NOTE — Progress Notes (Signed)
Virtual Visit via Video   I connected with patient on 06/13/19 at  9:30 AM EST by a video enabled telemedicine application and verified that I am speaking with the correct person using two identifiers.  Location patient: Home Location provider: [facility], [facility] Persons participating in the virtual visit: Patient, Provider, [place] (Patina Moore)  I discussed the limitations of evaluation and management by telemedicine and the availability of in person appointments. The patient expressed understanding and agreed to proceed.  Subjective:   HPI:   Patient presents via Doxy.Me today c/o 5 days of R ear pain with fullness. Notes nasal congestion, sinus congestion, facial pain. Notes he tried to flush ear canal out without any relief.  Notes some decreased hearing since trying to do this. Denies chest congestion, cough, change in taste or smell, recent travel or sick contact. Denies fever, chills. Has been using Flonase and Afrin.   ROS:   See pertinent positives and negatives per HPI.  Patient Active Problem List   Diagnosis Date Noted  . GAD (generalized anxiety disorder) 01/06/2018  . Acute appendicitis 04/23/2015    Social History   Tobacco Use  . Smoking status: Former Smoker    Packs/day: 0.50    Years: 10.00    Pack years: 5.00    Types: Cigarettes  . Smokeless tobacco: Never Used  Substance Use Topics  . Alcohol use: No    Current Outpatient Medications:  .  amitriptyline (ELAVIL) 25 MG tablet, TAKE 1 TABLET BY MOUTH EVERYDAY AT BEDTIME, Disp: 90 tablet, Rfl: 0 .  beclomethasone (QVAR) 80 MCG/ACT inhaler, Inhale 1 puff into the lungs 2 (two) times daily. (Patient taking differently: Inhale 1 puff into the lungs 2 (two) times daily as needed (for flares). ), Disp: 10.6 g, Rfl: 0 .  clonazePAM (KLONOPIN) 0.5 MG tablet, Take 1 tablet (0.5 mg total) by mouth 2 (two) times daily as needed., Disp: 60 tablet, Rfl: 0 .  famotidine (PEPCID) 20 MG tablet, Take 1 tablet  (20 mg total) by mouth 2 (two) times daily., Disp: 60 tablet, Rfl: 3 .  fluticasone (FLONASE) 50 MCG/ACT nasal spray, Place 2 sprays into both nostrils daily as needed for rhinitis (or seasonal allergies). , Disp: , Rfl:  .  ondansetron (ZOFRAN ODT) 8 MG disintegrating tablet, Take 1 tablet (8 mg total) by mouth every 8 (eight) hours as needed for nausea or vomiting., Disp: 50 tablet, Rfl: 0 .  pantoprazole (PROTONIX) 40 MG tablet, Take 1 tablet (40 mg total) by mouth 2 (two) times daily., Disp: 60 tablet, Rfl: 3 .  potassium chloride (K-DUR) 10 MEQ tablet, Take 1 tablet (10 mEq total) by mouth 2 (two) times daily., Disp: 10 tablet, Rfl: 0 .  valACYclovir (VALTREX) 1000 MG tablet, Take 1 tablet (1,000 mg total) by mouth 3 (three) times daily., Disp: 21 tablet, Rfl: 0  Allergies  Allergen Reactions  . Contrast Media [Iodinated Diagnostic Agents] Hives and Itching    Itching , hives on 04-23-15 CT exam 05-10-15---pt got 1 hr emergent prep in ED for CT -dosed with Barium--no reaction noted  . Penicillins Other (See Comments)    From childhood- reaction not recalled Has patient had a PCN reaction causing immediate rash, facial/tongue/throat swelling, SOB or lightheadedness with hypotension: Unk Has patient had a PCN reaction causing severe rash involving mucus membranes or skin necrosis: Unk Has patient had a PCN reaction that required hospitalization: Unk Has patient had a PCN reaction occurring within the last 10 years: No  If all of the above answers are "NO", then may proceed with Cephalosporin use.     Objective:   There were no vitals taken for this visit.  Patient is well-developed, well-nourished in no acute distress.  Resting comfortably at home.  Head is normocephalic, atraumatic.  No labored breathing.  Speech is clear and coherent with logical content.  Patient is alert and oriented at baseline.   Assessment and Plan:   1. Viral sinusitis Continue Flonase. Start Claritin-D.  Avoid afrin.  Increase fluids.  Rest.  Saline nasal spray.  Probiotic.  Mucinex as directed.  Humidifier in bedroom.  Call or return to clinic if symptoms are not improving.  2. Eustachian tube dysfunction, right Flonase and Claritin-D. Supportive measures reviewed.  3. Impacted cerumen of right ear Suspected after he attempted to irrigate canals at home. Due to sinus symptoms, cannot see him in office presently (COVID restrictions). Recommend Debrox. Supportive measures reviewed. If not improving/resolving will need to be seen at Urgent Care for removal.     [name], PA-C 06/13/2019

## 2019-06-13 NOTE — Patient Instructions (Signed)
Instructions sent to MyChart.   Please keep hydrated Continue your Flonase. Start a saline nasal rinse. Limit use of Afrin to 2 days or less. Start Claritin-D.  Let me know if symptoms are not improving, if anything worsens or new symptoms develop as this would raise concern for a bacterial sinusitis and need for antibiotic.    For the ears. Avoid further home irrigation. Get some Debrox OTC to use to help break up ear wax. If not improving or anything worsens let me know ASAP so we can figure out how to get you seen for manual removal.

## 2019-06-15 ENCOUNTER — Other Ambulatory Visit: Payer: Self-pay

## 2019-06-15 ENCOUNTER — Ambulatory Visit (INDEPENDENT_AMBULATORY_CARE_PROVIDER_SITE_OTHER): Payer: 59 | Admitting: Physician Assistant

## 2019-06-15 ENCOUNTER — Encounter: Payer: Self-pay | Admitting: Physician Assistant

## 2019-06-15 VITALS — BP 120/80 | HR 82 | Temp 98.7°F | Resp 14 | Ht 66.0 in | Wt 123.0 lb

## 2019-06-15 DIAGNOSIS — H6123 Impacted cerumen, bilateral: Secondary | ICD-10-CM

## 2019-06-15 NOTE — Progress Notes (Signed)
Patient presents to clinic today c/o decreased hearing and fullness of right ear over the past week.  Has been trying home ear irrigations and Debrox without much success.  Notes ear pain with this.  Denies any current sinus symptoms.  Denies fever, chills, cough..   Past Medical History:  Diagnosis Date  . Appendicitis   . Asthma   . Crohn disease ([place])   . Periodontitis     Current Outpatient Medications on File Prior to Visit  Medication Sig Dispense Refill  . amitriptyline (ELAVIL) 25 MG tablet TAKE 1 TABLET BY MOUTH EVERYDAY AT BEDTIME 90 tablet 0  . beclomethasone (QVAR) 80 MCG/ACT inhaler Inhale 1 puff into the lungs 2 (two) times daily. (Patient taking differently: Inhale 1 puff into the lungs 2 (two) times daily as needed (for flares). ) 10.6 g 0  . clonazePAM (KLONOPIN) 0.5 MG tablet Take 1 tablet (0.5 mg total) by mouth 2 (two) times daily as needed. 60 tablet 0  . famotidine (PEPCID) 20 MG tablet Take 1 tablet (20 mg total) by mouth 2 (two) times daily. 60 tablet 3  . fluticasone (FLONASE) 50 MCG/ACT nasal spray Place 2 sprays into both nostrils daily as needed for rhinitis (or seasonal allergies).     . ondansetron (ZOFRAN ODT) 8 MG disintegrating tablet Take 1 tablet (8 mg total) by mouth every 8 (eight) hours as needed for nausea or vomiting. 50 tablet 0  . pantoprazole (PROTONIX) 40 MG tablet Take 1 tablet (40 mg total) by mouth 2 (two) times daily. 60 tablet 3  . potassium chloride (K-DUR) 10 MEQ tablet Take 1 tablet (10 mEq total) by mouth 2 (two) times daily. 10 tablet 0  . valACYclovir (VALTREX) 1000 MG tablet Take 1 tablet (1,000 mg total) by mouth 3 (three) times daily. 21 tablet 0   No current facility-administered medications on file prior to visit.    Allergies  Allergen Reactions  . Contrast Media [Iodinated Diagnostic Agents] Hives and Itching    Itching , hives on 04-23-15 CT exam 05-10-15---pt got 1 hr emergent prep in ED for CT -dosed with Barium--no  reaction noted  . Penicillins Other (See Comments)    From childhood- reaction not recalled Has patient had a PCN reaction causing immediate rash, facial/tongue/throat swelling, SOB or lightheadedness with hypotension: Unk Has patient had a PCN reaction causing severe rash involving mucus membranes or skin necrosis: Unk Has patient had a PCN reaction that required hospitalization: Unk Has patient had a PCN reaction occurring within the last 10 years: No If all of the above answers are "NO", then may proceed with Cephalosporin use.     Family History  Problem Relation Age of Onset  . Healthy Mother   . Breast cancer Mother   . Ovarian cancer Mother   . Colon cancer Neg Hx   . Esophageal cancer Neg Hx   . Rectal cancer Neg Hx   . Stomach cancer Neg Hx     Social History   Socioeconomic History  . Marital status: Married    Spouse name: Not on file  . Number of children: 2  . Years of education: Not on file  . Highest education level: Not on file  Occupational History  . Occupation: Hemp Farmer  Tobacco Use  . Smoking status: Former Smoker    Packs/day: 0.50    Years: 10.00    Pack years: 5.00    Types: Cigarettes  . Smokeless tobacco: Never Used  Substance and Sexual  Activity  . Alcohol use: No  . Drug use: Yes    Types: Marijuana  . Sexual activity: Yes  Other Topics Concern  . Not on file  Social History Narrative  . Not on file   Social Determinants of Health   Financial Resource Strain:   . Difficulty of Paying Living Expenses: Not on file  Food Insecurity:   . Worried About Charity fundraiser in the Last Year: Not on file  . Ran Out of Food in the Last Year: Not on file  Transportation Needs:   . Lack of Transportation (Medical): Not on file  . Lack of Transportation (Non-Medical): Not on file  Physical Activity:   . Days of Exercise per Week: Not on file  . Minutes of Exercise per Session: Not on file  Stress:   . Feeling of Stress : Not on file    Social Connections:   . Frequency of Communication with Friends and Family: Not on file  . Frequency of Social Gatherings with Friends and Family: Not on file  . Attends Religious Services: Not on file  . Active Member of Clubs or Organizations: Not on file  . Attends Archivist Meetings: Not on file  . Marital Status: Not on file   Review of Systems - See HPI.  All other ROS are negative.  BP 120/80   Pulse 82   Temp 98.7 F (37.1 C) (Temporal)   Resp 14   Ht 5' 6"  (1.676 m)   Wt 123 lb (55.8 kg)   SpO2 99%   BMI 19.85 kg/m   Physical Exam Vitals reviewed.  Constitutional:      Appearance: Normal appearance.  HENT:     Right Ear: External ear normal. There is impacted cerumen.     Left Ear: External ear normal. There is impacted cerumen.  Cardiovascular:     Rate and Rhythm: Normal rate and regular rhythm.     Pulses: Normal pulses.     Heart sounds: Normal heart sounds.  Musculoskeletal:     Cervical back: Neck supple.  Neurological:     General: No focal deficit present.     Mental Status: He is alert.     Assessment/Plan: 1. Bilateral impacted cerumen Patient actually with cerumen impaction bilaterally, although the right ear is the only 1 currently symptomatic.  Manual removal with curette and ear irrigation attempted but unable resolve impaction, especially due to patient tolerability.  Home care reviewed with patient.  Patient referred to ENT for removal a via suction.   - Ambulatory referral to ENT  This visit occurred during the SARS-CoV-2 public health emergency.  Safety protocols were in place, including screening questions prior to the visit, additional usage of staff PPE, and extensive cleaning of exam room while observing appropriate contact time as indicated for disinfecting solutions.     [name], PA-C

## 2019-06-15 NOTE — Patient Instructions (Signed)
I have placed a referral to ENT for you to get the R ear impaction removed since you are not tolerating well in office.   You will be contacted to schedule.  If you do not hear from them by the end of the week, let me know.   Ok to continue the Debrox at home until you see the specialist.

## 2019-06-15 NOTE — Telephone Encounter (Signed)
Please schedule patient for appointment today.

## 2019-06-15 NOTE — Telephone Encounter (Signed)
Patient scheduled for today at 11am for ear cleaning

## 2019-06-24 ENCOUNTER — Encounter (INDEPENDENT_AMBULATORY_CARE_PROVIDER_SITE_OTHER): Payer: Self-pay | Admitting: Otolaryngology

## 2019-06-24 ENCOUNTER — Ambulatory Visit (INDEPENDENT_AMBULATORY_CARE_PROVIDER_SITE_OTHER): Payer: 59 | Admitting: Otolaryngology

## 2019-06-24 ENCOUNTER — Other Ambulatory Visit: Payer: Self-pay

## 2019-06-24 VITALS — Temp 97.9°F

## 2019-06-24 DIAGNOSIS — H6123 Impacted cerumen, bilateral: Secondary | ICD-10-CM

## 2019-06-24 NOTE — Progress Notes (Signed)
HPI: Paul Arnold is a 39 y.o. male who presents for evaluation of blockage of his ears bilaterally.  He has never had his ears cleaned..  Past Medical History:  Diagnosis Date  . Appendicitis   . Asthma   . Crohn disease ([place])   . Periodontitis    Past Surgical History:  Procedure Laterality Date  . APPENDECTOMY  04/23/2015   laproscopic   . LAPAROSCOPIC APPENDECTOMY N/A 04/23/2015   Procedure: APPENDECTOMY LAPAROSCOPIC;  Surgeon: Judeth Horn, MD;  Location: [place];  Service: General;  Laterality: N/A;   Social History   Socioeconomic History  . Marital status: Married    Spouse name: Not on file  . Number of children: 2  . Years of education: Not on file  . Highest education level: Not on file  Occupational History  . Occupation: Hemp Farmer  Tobacco Use  . Smoking status: Former Smoker    Packs/day: 0.33    Years: 14.00    Pack years: 4.62    Types: Cigarettes    Start date: 2006    Quit date: 2020    Years since quitting: 1.0  . Smokeless tobacco: Never Used  Substance and Sexual Activity  . Alcohol use: No  . Drug use: Yes    Types: Marijuana  . Sexual activity: Yes  Other Topics Concern  . Not on file  Social History Narrative  . Not on file   Social Determinants of Health   Financial Resource Strain:   . Difficulty of Paying Living Expenses: Not on file  Food Insecurity:   . Worried About Charity fundraiser in the Last Year: Not on file  . Ran Out of Food in the Last Year: Not on file  Transportation Needs:   . Lack of Transportation (Medical): Not on file  . Lack of Transportation (Non-Medical): Not on file  Physical Activity:   . Days of Exercise per Week: Not on file  . Minutes of Exercise per Session: Not on file  Stress:   . Feeling of Stress : Not on file  Social Connections:   . Frequency of Communication with Friends and Family: Not on file  . Frequency of Social Gatherings with Friends and Family: Not on file  . Attends Religious  Services: Not on file  . Active Member of Clubs or Organizations: Not on file  . Attends Archivist Meetings: Not on file  . Marital Status: Not on file   Family History  Problem Relation Age of Onset  . Healthy Mother   . Breast cancer Mother   . Ovarian cancer Mother   . Colon cancer Neg Hx   . Esophageal cancer Neg Hx   . Rectal cancer Neg Hx   . Stomach cancer Neg Hx    Allergies  Allergen Reactions  . Contrast Media [Iodinated Diagnostic Agents] Hives and Itching    Itching , hives on 04-23-15 CT exam 05-10-15---pt got 1 hr emergent prep in ED for CT -dosed with Barium--no reaction noted  . Penicillins Other (See Comments)    From childhood- reaction not recalled Has patient had a PCN reaction causing immediate rash, facial/tongue/throat swelling, SOB or lightheadedness with hypotension: Unk Has patient had a PCN reaction causing severe rash involving mucus membranes or skin necrosis: Unk Has patient had a PCN reaction that required hospitalization: Unk Has patient had a PCN reaction occurring within the last 10 years: No If all of the above answers are "NO", then may proceed  with Cephalosporin use.    Prior to Admission medications   Medication Sig Start Date End Date Taking? Authorizing Provider  amitriptyline (ELAVIL) 25 MG tablet TAKE 1 TABLET BY MOUTH EVERYDAY AT BEDTIME 04/07/19  Yes Brunetta Jeans, PA-C  beclomethasone (QVAR) 80 MCG/ACT inhaler Inhale 1 puff into the lungs 2 (two) times daily. Patient taking differently: Inhale 1 puff into the lungs 2 (two) times daily as needed (for flares).  02/15/18  Yes Brunetta Jeans, PA-C  clonazePAM (KLONOPIN) 0.5 MG tablet Take 1 tablet (0.5 mg total) by mouth 2 (two) times daily as needed. 05/03/19  Yes Brunetta Jeans, PA-C  famotidine (PEPCID) 20 MG tablet Take 1 tablet (20 mg total) by mouth 2 (two) times daily. 03/07/19  Yes Thornton Park, MD  fluticasone (FLONASE) 50 MCG/ACT nasal spray Place 2 sprays  into both nostrils daily as needed for rhinitis (or seasonal allergies).    Yes [provider]  ondansetron (ZOFRAN ODT) 8 MG disintegrating tablet Take 1 tablet (8 mg total) by mouth every 8 (eight) hours as needed for nausea or vomiting. 03/07/19  Yes Thornton Park, MD  pantoprazole (PROTONIX) 40 MG tablet Take 1 tablet (40 mg total) by mouth 2 (two) times daily. 03/07/19  Yes Thornton Park, MD  potassium chloride (K-DUR) 10 MEQ tablet Take 1 tablet (10 mEq total) by mouth 2 (two) times daily. 09/08/18  Yes Hagler, Aaron Edelman, MD  valACYclovir (VALTREX) 1000 MG tablet Take 1 tablet (1,000 mg total) by mouth 3 (three) times daily. 03/15/19  Yes Brunetta Jeans, PA-C     Positive ROS: Otherwise negative  All other systems have been reviewed and were otherwise negative with the exception of those mentioned in the HPI and as above.  Physical Exam: Constitutional: Alert, well-appearing, no acute distress Ears: External ears without lesions or tenderness. Ear canals hard impacted wax bilaterally.  This was cleaned with suction as well as irrigation.  On the left side he had some slight excessive skin from chronic obstruction of cerumen.. Nasal: External nose without lesions. Clear nasal passages Oral: Oropharynx clear. Neck: No palpable adenopathy or masses Respiratory: Breathing comfortably  Skin: No facial/neck lesions or rash noted.  Cerumen impaction removal  Date/Time: 06/24/2019 5:10 PM Performed by: Rozetta Nunnery, MD Authorized by: Rozetta Nunnery, MD   Consent:    Consent obtained:  Verbal   Consent given by:  Patient   Risks discussed:  Pain and bleeding Procedure details:    Location:  L ear and R ear   Procedure type: curette, irrigation, suction and forceps   Post-procedure details:    Inspection:  TM intact and canal normal   Hearing quality:  Improved   Patient tolerance of procedure:  Tolerated well, no immediate complications Comments:      Both ear canals were completely impacted with cerumen with some excessive skin buildup on the left side.  TMs were clear bilaterally    Assessment: Bilateral cerumen impactions  Plan: Recommended follow-up on a yearly basis to have his ears cleaned.  Radene Journey, MD

## 2019-08-22 ENCOUNTER — Other Ambulatory Visit: Payer: Self-pay | Admitting: Emergency Medicine

## 2019-08-22 ENCOUNTER — Encounter: Payer: Self-pay | Admitting: Physician Assistant

## 2019-08-22 DIAGNOSIS — F411 Generalized anxiety disorder: Secondary | ICD-10-CM

## 2019-08-22 MED ORDER — CLONAZEPAM 0.5 MG PO TABS: 0.5000 mg | ORAL_TABLET | Freq: Two times a day (BID) | ORAL | 0 refills | Status: DC | PRN

## 2019-08-22 NOTE — Telephone Encounter (Signed)
Clonazepam last rx 05/03/19 #60 LOV: 06/15/19 Bilateral cerumen impaction CSC:05/07/2018

## 2019-09-07 ENCOUNTER — Telehealth: Payer: 59 | Admitting: Physician Assistant

## 2019-09-19 ENCOUNTER — Encounter (HOSPITAL_BASED_OUTPATIENT_CLINIC_OR_DEPARTMENT_OTHER): Payer: Self-pay | Admitting: *Deleted

## 2019-09-19 ENCOUNTER — Ambulatory Visit (INDEPENDENT_AMBULATORY_CARE_PROVIDER_SITE_OTHER): Payer: 59 | Admitting: Physician Assistant

## 2019-09-19 ENCOUNTER — Other Ambulatory Visit: Payer: Self-pay

## 2019-09-19 ENCOUNTER — Emergency Department (HOSPITAL_BASED_OUTPATIENT_CLINIC_OR_DEPARTMENT_OTHER): Payer: 59

## 2019-09-19 ENCOUNTER — Emergency Department (HOSPITAL_BASED_OUTPATIENT_CLINIC_OR_DEPARTMENT_OTHER)
Admission: EM | Admit: 2019-09-19 | Discharge: 2019-09-19 | Disposition: A | Discharge status: Home / Self Care | Payer: 59 | Attending: Emergency Medicine | Admitting: Emergency Medicine

## 2019-09-19 ENCOUNTER — Encounter: Payer: Self-pay | Admitting: Physician Assistant

## 2019-09-19 VITALS — BP 98/62 | HR 97 | Temp 99.8°F | Resp 16 | Ht 66.0 in | Wt 109.0 lb

## 2019-09-19 DIAGNOSIS — J45909 Unspecified asthma, uncomplicated: Secondary | ICD-10-CM | POA: Insufficient documentation

## 2019-09-19 DIAGNOSIS — Z79899 Other long term (current) drug therapy: Secondary | ICD-10-CM | POA: Diagnosis not present

## 2019-09-19 DIAGNOSIS — N410 Acute prostatitis: Secondary | ICD-10-CM

## 2019-09-19 DIAGNOSIS — R825 Elevated urine levels of drugs, medicaments and biological substances: Secondary | ICD-10-CM

## 2019-09-19 DIAGNOSIS — R1030 Lower abdominal pain, unspecified: Secondary | ICD-10-CM

## 2019-09-19 DIAGNOSIS — Z87891 Personal history of nicotine dependence: Secondary | ICD-10-CM | POA: Diagnosis not present

## 2019-09-19 LAB — CBC WITH DIFFERENTIAL/PLATELET
Abs Immature Granulocytes: 0.01 10*3/uL (ref 0.00–0.07)
Basophils Absolute: 0 10*3/uL (ref 0.0–0.1)
Basophils Relative: 0 %
Eosinophils Absolute: 0.1 10*3/uL (ref 0.0–0.5)
Eosinophils Relative: 1 %
HCT: 42.7 % (ref 39.0–52.0)
Hemoglobin: 15.6 g/dL (ref 13.0–17.0)
Immature Granulocytes: 0 %
Lymphocytes Relative: 34 %
Lymphs Abs: 3.4 10*3/uL (ref 0.7–4.0)
MCH: 34 pg (ref 26.0–34.0)
MCHC: 36.5 g/dL — ABNORMAL HIGH (ref 30.0–36.0)
MCV: 93 fL (ref 80.0–100.0)
Monocytes Absolute: 1 10*3/uL (ref 0.1–1.0)
Monocytes Relative: 10 %
Neutro Abs: 5.6 10*3/uL (ref 1.7–7.7)
Neutrophils Relative %: 55 %
Platelets: 207 10*3/uL (ref 150–400)
RBC: 4.59 MIL/uL (ref 4.22–5.81)
RDW: 11.9 % (ref 11.5–15.5)
WBC: 10.1 10*3/uL (ref 4.0–10.5)
nRBC: 0 % (ref 0.0–0.2)

## 2019-09-19 LAB — URINALYSIS, ROUTINE W REFLEX MICROSCOPIC
Bilirubin Urine: NEGATIVE
Glucose, UA: NEGATIVE mg/dL
Hgb urine dipstick: NEGATIVE
Ketones, ur: 15 mg/dL — AB
Leukocytes,Ua: NEGATIVE
Nitrite: NEGATIVE
Protein, ur: NEGATIVE mg/dL
Specific Gravity, Urine: 1.01 (ref 1.005–1.030)
pH: 6 (ref 5.0–8.0)

## 2019-09-19 LAB — POCT URINALYSIS DIPSTICK
Bilirubin, UA: NEGATIVE
Blood, UA: NEGATIVE
Glucose, UA: NEGATIVE
Ketones, UA: NEGATIVE
Leukocytes, UA: NEGATIVE
Nitrite, UA: NEGATIVE
Protein, UA: NEGATIVE
Spec Grav, UA: <=1.005 — AB (ref 1.010–1.025)
Urobilinogen, UA: 0.2 E.U./dL
pH, UA: 6 (ref 5.0–8.0)

## 2019-09-19 LAB — RAPID URINE DRUG SCREEN, HOSP PERFORMED
Amphetamines: NOT DETECTED
Barbiturates: NOT DETECTED
Benzodiazepines: NOT DETECTED
Cocaine: NOT DETECTED
Opiates: NOT DETECTED
Tetrahydrocannabinol: POSITIVE — AB

## 2019-09-19 LAB — COMPREHENSIVE METABOLIC PANEL
ALT: 21 U/L (ref 0–44)
AST: 17 U/L (ref 15–41)
Albumin: 5.1 g/dL — ABNORMAL HIGH (ref 3.5–5.0)
Alkaline Phosphatase: 56 U/L (ref 38–126)
Anion gap: 14 (ref 5–15)
BUN: 11 mg/dL (ref 6–20)
CO2: 29 mmol/L (ref 22–32)
Calcium: 9.5 mg/dL (ref 8.9–10.3)
Chloride: 93 mmol/L — ABNORMAL LOW (ref 98–111)
Creatinine, Ser: 1.01 mg/dL (ref 0.61–1.24)
GFR calc Af Amer: >60 mL/min (ref >60)
GFR calc non Af Amer: >60 mL/min (ref >60)
Glucose, Bld: 97 mg/dL (ref 70–99)
Potassium: 3 mmol/L — ABNORMAL LOW (ref 3.5–5.1)
Sodium: 136 mmol/L (ref 135–145)
Total Bilirubin: 0.7 mg/dL (ref 0.3–1.2)
Total Protein: 7.9 g/dL (ref 6.5–8.1)

## 2019-09-19 LAB — MAGNESIUM: Magnesium: 2.1 mg/dL (ref 1.7–2.4)

## 2019-09-19 MED ORDER — LACTATED RINGERS IV BOLUS
1000.0000 mL | Freq: Once | INTRAVENOUS | Status: AC
  Administered 2019-09-19: 1000 mL via INTRAVENOUS

## 2019-09-19 MED ORDER — ONDANSETRON 4 MG PO TBDP
4.0000 mg | ORAL_TABLET | Freq: Three times a day (TID) | ORAL | 0 refills | Status: DC | PRN
Start: 2019-09-19 — End: 2019-09-29

## 2019-09-19 MED ORDER — ONDANSETRON HCL 4 MG/2ML IJ SOLN
4.0000 mg | Freq: Once | INTRAMUSCULAR | Status: AC
  Administered 2019-09-19: 4 mg via INTRAVENOUS
  Filled 2019-09-19: qty 2

## 2019-09-19 MED ORDER — HYDROCORTISONE NA SUCCINATE PF 100 MG IJ SOLR
INTRAMUSCULAR | Status: AC
  Filled 2019-09-19: qty 4

## 2019-09-19 MED ORDER — MORPHINE SULFATE (PF) 4 MG/ML IV SOLN
4.0000 mg | Freq: Once | INTRAVENOUS | Status: AC
  Administered 2019-09-19: 4 mg via INTRAVENOUS
  Filled 2019-09-19: qty 1

## 2019-09-19 MED ORDER — DIPHENHYDRAMINE HCL 50 MG/ML IJ SOLN
50.0000 mg | Freq: Once | INTRAMUSCULAR | Status: AC
  Administered 2019-09-19: 50 mg via INTRAVENOUS
  Filled 2019-09-19: qty 1

## 2019-09-19 MED ORDER — DIPHENHYDRAMINE HCL 25 MG PO CAPS: 50.0000 mg | ORAL_CAPSULE | Freq: Once | ORAL | Status: AC

## 2019-09-19 MED ORDER — HYDROCORTISONE NA SUCCINATE PF 250 MG IJ SOLR
200.0000 mg | Freq: Once | INTRAMUSCULAR | Status: AC
  Administered 2019-09-19: 200 mg via INTRAVENOUS
  Filled 2019-09-19: qty 200

## 2019-09-19 MED ORDER — POTASSIUM CHLORIDE 10 MEQ/100ML IV SOLN
10.0000 meq | Freq: Once | INTRAVENOUS | Status: AC
  Administered 2019-09-19: 10 meq via INTRAVENOUS
  Filled 2019-09-19: qty 100

## 2019-09-19 MED ORDER — POTASSIUM CHLORIDE ER 10 MEQ PO TBCR
10.0000 meq | EXTENDED_RELEASE_TABLET | Freq: Every day | ORAL | 0 refills | Status: DC
Start: 2019-09-19 — End: 2019-10-06

## 2019-09-19 MED ORDER — SULFAMETHOXAZOLE-TRIMETHOPRIM 800-160 MG PO TABS
1.0000 | ORAL_TABLET | Freq: Two times a day (BID) | ORAL | 0 refills | Status: DC
Start: 2019-09-19 — End: 2019-09-29

## 2019-09-19 MED ORDER — HYDROMORPHONE HCL 1 MG/ML IJ SOLN
1.0000 mg | Freq: Once | INTRAMUSCULAR | Status: AC
  Administered 2019-09-19: 15:00:00 1 mg via INTRAVENOUS
  Filled 2019-09-19: qty 1

## 2019-09-19 MED ORDER — IOHEXOL 300 MG/ML  SOLN
100.0000 mL | Freq: Once | INTRAMUSCULAR | Status: AC | PRN
  Administered 2019-09-19: 100 mL via INTRAVENOUS

## 2019-09-19 NOTE — Patient Instructions (Signed)
Please have your wife take you to the ER at [facility] -- see address below. You need emergent assessment for severity of your symptoms today and for pain control. This is extremely important as it would take me days to get things scheduled for you and you need help now.  Since your wife drove you here I am fine with her taking you there.    [place], [place] [address]

## 2019-09-19 NOTE — ED Provider Notes (Signed)
Care assumed from [place], [place], at shift change, please see their notes for full documentation of patient's complaint/HPI. Briefly, pt here with right testicular pain. Results so far show no leukocytosis, hypokalemia at 3.0, negative U/A and ultrasound with findings of mildly complex fluid in the testicle itself. Awaiting CT scan however pt has allergy to contrast dye so he is being premedicated. Plan is to dispo accordingly; possible treatment for prostatitis given pain with rectal examination by prior ED PA.   Physical Exam  BP 116/67   Pulse 80   Temp 98.7 F (37.1 C) (Oral)   Resp 20   Ht 5' 6"  (1.676 m)   Wt 48.9 kg   SpO2 100%   BMI 17.40 kg/m   Physical Exam  ED Course/Procedures     Procedures  Results for orders placed or performed during the hospital encounter of 09/19/19  CBC with Differential  Result Value Ref Range   WBC 10.1 4.0 - 10.5 K/uL   RBC 4.59 4.22 - 5.81 MIL/uL   Hemoglobin 15.6 13.0 - 17.0 g/dL   HCT 42.7 39.0 - 52.0 %   MCV 93.0 80.0 - 100.0 fL   MCH 34.0 26.0 - 34.0 pg   MCHC 36.5 (H) 30.0 - 36.0 g/dL   RDW 11.9 11.5 - 15.5 %   Platelets 207 150 - 400 K/uL   nRBC 0.0 0.0 - 0.2 %   Neutrophils Relative % 55 %   Neutro Abs 5.6 1.7 - 7.7 K/uL   Lymphocytes Relative 34 %   Lymphs Abs 3.4 0.7 - 4.0 K/uL   Monocytes Relative 10 %   Monocytes Absolute 1.0 0.1 - 1.0 K/uL   Eosinophils Relative 1 %   Eosinophils Absolute 0.1 0.0 - 0.5 K/uL   Basophils Relative 0 %   Basophils Absolute 0.0 0.0 - 0.1 K/uL   Immature Granulocytes 0 %   Abs Immature Granulocytes 0.01 0.00 - 0.07 K/uL  Comprehensive metabolic panel  Result Value Ref Range   Sodium 136 135 - 145 mmol/L   Potassium 3.0 (L) 3.5 - 5.1 mmol/L   Chloride 93 (L) 98 - 111 mmol/L   CO2 29 22 - 32 mmol/L   Glucose, Bld 97 70 - 99 mg/dL   BUN 11 6 - 20 mg/dL   Creatinine, Ser 1.01 0.61 - 1.24 mg/dL   Calcium 9.5 8.9 - 10.3 mg/dL   Total Protein 7.9 6.5 - 8.1 g/dL   Albumin 5.1 (H) 3.5 -  5.0 g/dL   AST 17 15 - 41 U/L   ALT 21 0 - 44 U/L   Alkaline Phosphatase 56 38 - 126 U/L   Total Bilirubin 0.7 0.3 - 1.2 mg/dL   GFR calc non Af Amer >60 >60 mL/min   GFR calc Af Amer >60 >60 mL/min   Anion gap 14 5 - 15  Urinalysis, Routine w reflex microscopic  Result Value Ref Range   Color, Urine YELLOW YELLOW   APPearance CLEAR CLEAR   Specific Gravity, Urine 1.010 1.005 - 1.030   pH 6.0 5.0 - 8.0   Glucose, UA NEGATIVE NEGATIVE mg/dL   Hgb urine dipstick NEGATIVE NEGATIVE   Bilirubin Urine NEGATIVE NEGATIVE   Ketones, ur 15 (A) NEGATIVE mg/dL   Protein, ur NEGATIVE NEGATIVE mg/dL   Nitrite NEGATIVE NEGATIVE   Leukocytes,Ua NEGATIVE NEGATIVE  Urine rapid drug screen (hosp performed)  Result Value Ref Range   Opiates NONE DETECTED NONE DETECTED   Cocaine NONE DETECTED NONE DETECTED  Benzodiazepines NONE DETECTED NONE DETECTED   Amphetamines NONE DETECTED NONE DETECTED   Tetrahydrocannabinol POSITIVE (A) NONE DETECTED   Barbiturates NONE DETECTED NONE DETECTED  Magnesium  Result Value Ref Range   Magnesium 2.1 1.7 - 2.4 mg/dL   CT ABDOMEN PELVIS W CONTRAST  Result Date: 09/19/2019 CLINICAL DATA:  Low back pain nausea and vomiting EXAM: CT ABDOMEN AND PELVIS WITH CONTRAST TECHNIQUE: Multidetector CT imaging of the abdomen and pelvis was performed using the standard protocol following bolus administration of intravenous contrast. CONTRAST:  179m OMNIPAQUE IOHEXOL 300 MG/ML  SOLN COMPARISON:  09/10/2018 FINDINGS: Lower chest: No acute abnormality. Hepatobiliary: No focal liver abnormality is seen. No gallstones, gallbladder wall thickening, or biliary dilatation. Pancreas: Unremarkable. No pancreatic ductal dilatation or surrounding inflammatory changes. Spleen: Normal in size without focal abnormality. Adrenals/Urinary Tract: Adrenal glands are unremarkable. Kidneys are normal, without renal calculi, focal lesion, or hydronephrosis. Bladder is unremarkable. Stomach/Bowel: Stomach  is within normal limits. Previous appendectomy. No evidence of bowel wall thickening, distention, or inflammatory changes. Vascular/Lymphatic: No significant vascular findings are present. No enlarged abdominal or pelvic lymph nodes. Reproductive: Prostate is unremarkable.  Few prostate calcification Other: No abdominal wall hernia or abnormality. No abdominopelvic ascites. Musculoskeletal: No acute or significant osseous findings. IMPRESSION: No CT evidence for acute intra-abdominal or pelvic abnormality. Electronically Signed   By: KDonavan FoilM.D.   On: 09/19/2019 19:22   UKoreaSCROTUM W/DOPPLER  Result Date: 09/19/2019 CLINICAL DATA:  Right testicular pain EXAM: SCROTAL ULTRASOUND DOPPLER ULTRASOUND OF THE TESTICLES TECHNIQUE: Complete ultrasound examination of the testicles, epididymis, and other scrotal structures was performed. Color and spectral Doppler ultrasound were also utilized to evaluate blood flow to the testicles. COMPARISON:  None. FINDINGS: Right testicle Measurements: 4.4 x 2.8 x 2.7 cm. No mass or microlithiasis visualized. Left testicle Measurements: 4.4 x 3 x 3.3 cm. No mass or microlithiasis visualized. Right epididymis:  Normal in size and appearance. Left epididymis:  Normal in size and appearance. Hydrocele:  None visualized. Varicocele:  None visualized. Pulsed Doppler interrogation of both testes demonstrates normal low resistance arterial and venous waveforms bilaterally. Other: Small amount of mildly complex fluid within the wall of the scrotum along midline which may reflect a small abscess or liquified hematoma. IMPRESSION: 1. No testicular torsion.  No testicular mass. 2. Small amount of mildly complex fluid within the wall of the scrotum along midline but slightly towards the right, which may reflect a small abscess or liquified hematoma. Electronically Signed   By: HKathreen Devoid  On: 09/19/2019 13:54    MDM  CT scan without acute findings. Upon further eval pt reports he is  not concerned about STIs at this time. Reports he has been with his wife for 6 years; he has not cheated nor does he think she has strayed. Will treat for prostatitis s/2 E coli potentially. Will treat with Bactrim x 30 days. Pt advised to follow up with urology. He is requesting zofran for nausea. Will discharge with same as well as short course of KDUR given low K here. Pt advised to follow up with PCP for recheck of potassium in 1-2 weeks. Strict return precautions have been discussed with pt. He is in agreement with plan and stable for discharge home.   This note was prepared using Dragon voice recognition software and may include unintentional dictation errors due to the inherent limitations of voice recognition software.       VEustaquio Maize PA-C 09/19/19 2010  Margette Fast, MD 09/20/19 1336

## 2019-09-19 NOTE — Progress Notes (Signed)
Patient presents to clinic today complaining of severe bilateral lower abdominal pain with inability to have a bowel movement for 3 days.  Also noting significant nausea and and emesis with trying to eat or drink.  Has history of hyperemesis gravidarum and cannabinoid hyperemesis syndrome.  Denies no cannabis use in 3 weeks.  Denies recent surgical procedure.  Denies fever or chills.  Was seen at urgent care last week for flank pain and difficulty with urination.  Was diagnosed with kidney stone and given prescription for Flomax and Phenergan.  Was taking as directed but noted inability to urinate.  As such presented to the [facility] emergency department on 09/12/2019.  Work-up at that time included blood work, urine assessment (negative for infection but patient tested positive for amphetamines and cannabis.  Tested negative for benzodiazepine despite being prescribed Klonopin twice daily).  CT abdomen and pelvis obtained at that time was unremarkable.  Patient endorsed to EDP taking Adderall given to him by a friend.  States he was discharged home to follow-up with primary care provider and encouraged to cease all use of illicit substances.  Patient denies any use of medication since that time.  Does endorse taking his Klonopin 1-2 times daily as directed.  States he is very concerned because he has lost 20+ pounds in the past several months.  Is followed by gastroenterology by [facility] but has not followed up with him since June of last year.  States he felt everyone blamed his GI symptoms on his cannabis use and did not take the time to find other causes.  States at present his pain is a 10 out of 10.  Past Medical History:  Diagnosis Date  . Appendicitis   . Asthma   . Crohn disease ([place])   . Periodontitis     Current Outpatient Medications on File Prior to Visit  Medication Sig Dispense Refill  . amitriptyline (ELAVIL) 25 MG tablet TAKE 1 TABLET BY MOUTH EVERYDAY AT BEDTIME 90 tablet 0  .  beclomethasone (QVAR) 80 MCG/ACT inhaler Inhale 1 puff into the lungs 2 (two) times daily. (Patient taking differently: Inhale 1 puff into the lungs 2 (two) times daily as needed (for flares). ) 10.6 g 0  . clonazePAM (KLONOPIN) 0.5 MG tablet Take 1 tablet (0.5 mg total) by mouth 2 (two) times daily as needed. 60 tablet 0  . famotidine (PEPCID) 20 MG tablet Take 1 tablet (20 mg total) by mouth 2 (two) times daily. 60 tablet 3  . fluticasone (FLONASE) 50 MCG/ACT nasal spray Place 2 sprays into both nostrils daily as needed for rhinitis (or seasonal allergies).     . ondansetron (ZOFRAN ODT) 8 MG disintegrating tablet Take 1 tablet (8 mg total) by mouth every 8 (eight) hours as needed for nausea or vomiting. 50 tablet 0  . pantoprazole (PROTONIX) 40 MG tablet Take 1 tablet (40 mg total) by mouth 2 (two) times daily. 60 tablet 3  . potassium chloride (K-DUR) 10 MEQ tablet Take 1 tablet (10 mEq total) by mouth 2 (two) times daily. 10 tablet 0  . valACYclovir (VALTREX) 1000 MG tablet Take 1 tablet (1,000 mg total) by mouth 3 (three) times daily. 21 tablet 0   No current facility-administered medications on file prior to visit.    Allergies  Allergen Reactions  . Contrast Media [Iodinated Diagnostic Agents] Hives and Itching    Itching , hives on 04-23-15 CT exam 05-10-15---pt got 1 hr emergent prep in ED for CT -dosed with Barium--no  reaction noted  . Penicillins Other (See Comments)    From childhood- reaction not recalled Has patient had a PCN reaction causing immediate rash, facial/tongue/throat swelling, SOB or lightheadedness with hypotension: Unk Has patient had a PCN reaction causing severe rash involving mucus membranes or skin necrosis: Unk Has patient had a PCN reaction that required hospitalization: Unk Has patient had a PCN reaction occurring within the last 10 years: No If all of the above answers are "NO", then may proceed with Cephalosporin use.     Family History  Problem  Relation Age of Onset  . Healthy Mother   . Breast cancer Mother   . Ovarian cancer Mother   . Colon cancer Neg Hx   . Esophageal cancer Neg Hx   . Rectal cancer Neg Hx   . Stomach cancer Neg Hx     Social History   Socioeconomic History  . Marital status: Married    Spouse name: Not on file  . Number of children: 2  . Years of education: Not on file  . Highest education level: Not on file  Occupational History  . Occupation: Hemp Farmer  Tobacco Use  . Smoking status: Former Smoker    Packs/day: 0.33    Years: 14.00    Pack years: 4.62    Types: Cigarettes    Start date: 2006    Quit date: 2020    Years since quitting: 1.3  . Smokeless tobacco: Never Used  Substance and Sexual Activity  . Alcohol use: No  . Drug use: Yes    Types: Marijuana  . Sexual activity: Yes  Other Topics Concern  . Not on file  Social History Narrative  . Not on file   Social Determinants of Health   Financial Resource Strain:   . Difficulty of Paying Living Expenses:   Food Insecurity:   . Worried About Charity fundraiser in the Last Year:   . Arboriculturist in the Last Year:   Transportation Needs:   . Film/video editor (Medical):   Marland Kitchen Lack of Transportation (Non-Medical):   Physical Activity:   . Days of Exercise per Week:   . Minutes of Exercise per Session:   Stress:   . Feeling of Stress :   Social Connections:   . Frequency of Communication with Friends and Family:   . Frequency of Social Gatherings with Friends and Family:   . Attends Religious Services:   . Active Member of Clubs or Organizations:   . Attends Archivist Meetings:   Marland Kitchen Marital Status:    Review of Systems - See HPI.  All other ROS are negative.  Wt 109 lb (49.4 kg)   BMI 17.59 kg/m   Physical Exam Vitals reviewed.  Constitutional:      General: He is not in acute distress.    Appearance: He is well-developed. He is ill-appearing. He is not diaphoretic.     Comments: Very thin,  almost cachectic.  HENT:     Head: Normocephalic and atraumatic.  Eyes:     Extraocular Movements: Extraocular movements intact.  Cardiovascular:     Rate and Rhythm: Normal rate and regular rhythm.     Heart sounds: Normal heart sounds.  Pulmonary:     Effort: Pulmonary effort is normal.     Breath sounds: Normal breath sounds.  Abdominal:     Palpations: Abdomen is soft.     Tenderness: There is abdominal tenderness in the right lower quadrant,  suprapubic area, left upper quadrant and left lower quadrant. There is guarding. There is no rebound.     Hernia: No hernia is present.  Neurological:     General: No focal deficit present.     Mental Status: He is alert.  Psychiatric:        Mood and Affect: Mood is anxious.     Assessment/Plan: 1. Lower abdominal pain Severe.  Notes pain 10 out of 10.  Has significant pain even with very light palpation of the lower abdomen with noted guarding.  No palpable herniation on exam despite very thin frame.  UA obtained and negative for blood or sign of infection.  Again recent CT in ER was negative for stone.  Bowel sounds are present in all 4 quadrant.  Given severity of symptoms needs further assessment.  ER disposition given.  Declines EMS.  Wife is present and will take him to nearest emergency room. - POCT Urinalysis Dipstick  2. Positive urine drug screen Positive for amphetamines.  Did endorse to me today as well as EDP the other day that he has been using some Adderall given to him by a friend.  States he only took once, however his urine findings in the ER would suggest more common usage.  Also question since his benzo screen is negative he has possibly been trading his benzos for Adderall.  Patient denies this.  Repeat UDS today at patient request. No further controlled medication to come from this practice.  - Pain Mgmt, Profile 8 w/Conf, U    This visit occurred during the SARS-CoV-2 public health emergency.  Safety protocols were  in place, including screening questions prior to the visit, additional usage of staff PPE, and extensive cleaning of exam room while observing appropriate contact time as indicated for disinfecting solutions.      [name], PA-C

## 2019-09-19 NOTE — Discharge Instructions (Addendum)
Please pick up antibiotics and take as prescribed to cover for infection to your prostate as this may be the cause of your symptoms Follow up with [facility] for further evaluation  Please also follow up with  your PCP regarding your ED visit today Return to the ED for any worsening symptoms including worsening pain, excessive vomiting, fevers > 100.4

## 2019-09-19 NOTE — ED Notes (Signed)
Pt. Gave urine sample to previous RN and sent to lab, just checking off done.

## 2019-09-19 NOTE — ED Provider Notes (Signed)
[facility] EMERGENCY DEPARTMENT Provider Note   CSN: [id number] Arrival date & time: 09/19/19  1201     History Chief Complaint  Patient presents with  . Abdominal Pain    Paul Arnold is a 39 y.o. male.  HPI      Paul Arnold is a 39 y.o. male, with a history of appendectomy, Crohn's disease, asthma, presenting to the ED with groin and lower abdominal pain beginning around Wednesday, April 28. He states he has kept a generalized right testicular discomfort since onset, but he will have intermittent episodes of lower abdominal that feels as though it is radiating into the right testicle region.  This has made it so he cannot move without pain and cannot play with his kids. For several months, he has had intermittent issues with abdominal pain and nausea/vomiting.  He states he will eat or drink something, followed almost immediately by nausea and vomiting as well as lower abdominal pain. He has had multiple evaluations in the ED as well as evaluation by gastroenterology for this issue. States he has not used marijuana in the last 26 days.  Denies fever/chills, diarrhea, constipation, hematochezia/melena, scrotal swelling, dysuria, hematuria, decreased urine, pain with bowel movements, or any other complaints.   Past Medical History:  Diagnosis Date  . Appendicitis   . Asthma   . Crohn disease ([place])   . Periodontitis     Patient Active Problem List   Diagnosis Date Noted  . GAD (generalized anxiety disorder) 01/06/2018  . Acute appendicitis 04/23/2015    Past Surgical History:  Procedure Laterality Date  . APPENDECTOMY  04/23/2015   laproscopic   . LAPAROSCOPIC APPENDECTOMY N/A 04/23/2015   Procedure: APPENDECTOMY LAPAROSCOPIC;  Surgeon: Judeth Horn, MD;  Location: [facility] OR;  Service: General;  Laterality: N/A;       Family History  Problem Relation Age of Onset  . Healthy Mother   . Breast cancer Mother   . Ovarian cancer Mother   . Colon  cancer Neg Hx   . Esophageal cancer Neg Hx   . Rectal cancer Neg Hx   . Stomach cancer Neg Hx     Social History   Tobacco Use  . Smoking status: Former Smoker    Packs/day: 0.33    Years: 14.00    Pack years: 4.62    Types: Cigarettes    Start date: 2006    Quit date: 2020    Years since quitting: 1.3  . Smokeless tobacco: Never Used  Substance Use Topics  . Alcohol use: No  . Drug use: Yes    Types: Marijuana    Home Medications Prior to Admission medications   Medication Sig Start Date End Date Taking? Authorizing Provider  amitriptyline (ELAVIL) 25 MG tablet TAKE 1 TABLET BY MOUTH EVERYDAY AT BEDTIME 04/07/19   Brunetta Jeans, PA-C  beclomethasone (QVAR) 80 MCG/ACT inhaler Inhale 1 puff into the lungs 2 (two) times daily. Patient taking differently: Inhale 1 puff into the lungs 2 (two) times daily as needed (for flares).  02/15/18   Brunetta Jeans, PA-C  clonazePAM (KLONOPIN) 0.5 MG tablet Take 1 tablet (0.5 mg total) by mouth 2 (two) times daily as needed. 08/22/19   Brunetta Jeans, PA-C  famotidine (PEPCID) 20 MG tablet Take 1 tablet (20 mg total) by mouth 2 (two) times daily. 03/07/19   Thornton Park, MD  fluticasone (FLONASE) 50 MCG/ACT nasal spray Place 2 sprays into both nostrils daily as needed for  rhinitis (or seasonal allergies).     [provider]  ondansetron (ZOFRAN ODT) 8 MG disintegrating tablet Take 1 tablet (8 mg total) by mouth every 8 (eight) hours as needed for nausea or vomiting. 03/07/19   Thornton Park, MD  pantoprazole (PROTONIX) 40 MG tablet Take 1 tablet (40 mg total) by mouth 2 (two) times daily. 03/07/19   Thornton Park, MD  potassium chloride (K-DUR) 10 MEQ tablet Take 1 tablet (10 mEq total) by mouth 2 (two) times daily. 09/08/18   Vanessa Kick, MD  valACYclovir (VALTREX) 1000 MG tablet Take 1 tablet (1,000 mg total) by mouth 3 (three) times daily. 03/15/19   Brunetta Jeans, PA-C    Allergies    Contrast media  [iodinated diagnostic agents] and Penicillins  Review of Systems   Review of Systems  Constitutional: Negative for chills and fever.  Respiratory: Negative for shortness of breath.   Cardiovascular: Negative for chest pain.  Gastrointestinal: Positive for abdominal pain, nausea and vomiting. Negative for blood in stool, constipation and diarrhea.  Genitourinary: Positive for testicular pain. Negative for discharge, dysuria, hematuria and scrotal swelling.  Musculoskeletal: Negative for back pain.  Neurological: Negative for syncope.  All other systems reviewed and are negative.   Physical Exam Updated Vital Signs BP 117/84   Pulse 65   Temp 98.7 F (37.1 C) (Oral)   Resp 20   Ht 5' 6"  (1.676 m)   Wt 48.9 kg   SpO2 100%   BMI 17.40 kg/m   Physical Exam Vitals and nursing note reviewed. Exam conducted with a chaperone present.  Constitutional:      General: He is not in acute distress.    Appearance: He is well-developed. He is not diaphoretic.  HENT:     Head: Normocephalic and atraumatic.     Mouth/Throat:     Mouth: Mucous membranes are moist.     Pharynx: Oropharynx is clear.  Eyes:     Conjunctiva/sclera: Conjunctivae normal.  Cardiovascular:     Rate and Rhythm: Normal rate and regular rhythm.     Pulses: Normal pulses.          Radial pulses are 2+ on the right side and 2+ on the left side.       Posterior tibial pulses are 2+ on the right side and 2+ on the left side.     Heart sounds: Normal heart sounds.     Comments: Tactile temperature in the extremities appropriate and equal bilaterally. Pulmonary:     Effort: Pulmonary effort is normal. No respiratory distress.     Breath sounds: Normal breath sounds.  Abdominal:     Palpations: Abdomen is soft.     Tenderness: There is abdominal tenderness in the suprapubic area. There is no guarding.  Genitourinary:    Penis: Normal and circumcised.      Comments: Tenderness to the right testicle, especially the  superior aspect of the testicle.  Tenderness into the superior scrotum.  Patient was tender enough that I was unable to complete an assessment of the inguinal canal. No noted penile discharge.  No noted genital lesions. No swelling or erythema noted to the scrotum or penis.  Rectal Exam:  No external hemorrhoids, fissures, or lesions noted.  No frank blood or melena. No stool burden.  No foreign bodies noted.   Possible tenderness of the prostate, though difficult to fully assess due to patient compliance with the exam. RN, Sam, served as chaperone during the rectal exam. Musculoskeletal:  Cervical back: Neck supple.     Right lower leg: No edema.     Left lower leg: No edema.  Lymphadenopathy:     Cervical: No cervical adenopathy.  Skin:    General: Skin is warm and dry.  Neurological:     Mental Status: He is alert.  Psychiatric:        Mood and Affect: Mood and affect normal.        Speech: Speech normal.        Behavior: Behavior normal.     ED Results / Procedures / Treatments   Labs (all labs ordered are listed, but only abnormal results are displayed) Labs Reviewed  CBC WITH DIFFERENTIAL/PLATELET - Abnormal; Notable for the following components:      Result Value   MCHC 36.5 (*)    All other components within normal limits  COMPREHENSIVE METABOLIC PANEL - Abnormal; Notable for the following components:   Potassium 3.0 (*)    Chloride 93 (*)    Albumin 5.1 (*)    All other components within normal limits  URINALYSIS, ROUTINE W REFLEX MICROSCOPIC - Abnormal; Notable for the following components:   Ketones, ur 15 (*)    All other components within normal limits  RAPID URINE DRUG SCREEN, HOSP PERFORMED - Abnormal; Notable for the following components:   Tetrahydrocannabinol POSITIVE (*)    All other components within normal limits  URINE CULTURE  MAGNESIUM    EKG None  Radiology US SCROTUM W/DOPPLER  Result Date: 09/19/2019 CLINICAL DATA:  Right testicular  pain EXAM: SCROTAL ULTRASOUND DOPPLER ULTRASOUND OF THE TESTICLES TECHNIQUE: Complete ultrasound examination of the testicles, epididymis, and other scrotal structures was performed. Color and spectral Doppler ultrasound were also utilized to evaluate blood flow to the testicles. COMPARISON:  None. FINDINGS: Right testicle Measurements: 4.4 x 2.8 x 2.7 cm. No mass or microlithiasis visualized. Left testicle Measurements: 4.4 x 3 x 3.3 cm. No mass or microlithiasis visualized. Right epididymis:  Normal in size and appearance. Left epididymis:  Normal in size and appearance. Hydrocele:  None visualized. Varicocele:  None visualized. Pulsed Doppler interrogation of both testes demonstrates normal low resistance arterial and venous waveforms bilaterally. Other: Small amount of mildly complex fluid within the wall of the scrotum along midline which may reflect a small abscess or liquified hematoma. IMPRESSION: 1. No testicular torsion.  No testicular mass. 2. Small amount of mildly complex fluid within the wall of the scrotum along midline but slightly towards the right, which may reflect a small abscess or liquified hematoma. Electronically Signed   By: Kathreen Devoid   On: 09/19/2019 13:54    Procedures Procedures (including critical care time)  Medications Ordered in ED Medications  hydrocortisone sodium succinate (SOLU-CORTEF) 100 MG injection (has no administration in time range)  morphine 4 MG/ML injection 4 mg (4 mg Intravenous Given 09/19/19 1313)  ondansetron (ZOFRAN) injection 4 mg (4 mg Intravenous Given 09/19/19 1313)  lactated ringers bolus 1,000 mL (0 mLs Intravenous Stopped 09/19/19 1425)  potassium chloride 10 mEq in 100 mL IVPB ( Intravenous Stopped 09/19/19 1522)  HYDROmorphone (DILAUDID) injection 1 mg (1 mg Intravenous Given 09/19/19 1512)  hydrocortisone sodium succinate (SOLU-CORTEF) injection 200 mg (200 mg Intravenous Given 09/19/19 1507)  diphenhydrAMINE (BENADRYL) capsule 50 mg ( Oral See  Alternative 09/19/19 1804)    Or  diphenhydrAMINE (BENADRYL) injection 50 mg (50 mg Intravenous Given 09/19/19 1804)    ED Course  I have reviewed the triage vital signs and the  nursing notes.  Pertinent labs & imaging results that were available during my care of the patient were reviewed by me and considered in my medical decision making (see chart for details).    MDM Rules/Calculators/A&P                      Patient presents with acute complaints of lower abdominal pain and genital pain. He also notes several months of intermittent nausea/vomiting and lower abdominal pain, especially after eating. Though all his symptoms may be related, we focused on the acute complaints of genital pain and persistent lower abdominal pain that has been present over the last several days. I have personally reviewed and interpreted the patient's available lab work and imaging studies. Question of complex fluid collection in the scrotum. Patient required pretreatment prior to IV contrasted CT of the abdomen/pelvis.  Findings and plan of care discussed with Ronnald Ramp, MD.   End of shift patient care handoff report given to [facility], PA-C. Plan: Awaiting CT scan.  Due to possible prostatic tenderness, may consider starting the patient on course of antibiotics for prostatitis.  Likely urology follow-up.  Vitals:   09/19/19 1638 09/19/19 1700 09/19/19 1730 09/19/19 1800  BP: 116/67 109/71 128/78 106/72  Pulse: 80 75  64  Resp: 20 (!) 22 16 16   Temp:      TempSrc:      SpO2: 100% 98%  96%  Weight:      Height:         Final Clinical Impression(s) / ED Diagnoses Final diagnoses:  None    Rx / DC Orders ED Discharge Orders    None       Layla Maw 09/19/19 1858    Hayden Rasmussen, MD 09/19/19 1919

## 2019-09-19 NOTE — ED Triage Notes (Signed)
Abdominal pain for a week. Lower back pain and pain in the center of his lower abdomen. Vomiting.

## 2019-09-20 LAB — TIQ- AMBIGUOUS ORDER

## 2019-09-20 LAB — URINE CULTURE: Culture: NO GROWTH

## 2019-09-29 ENCOUNTER — Encounter: Payer: Self-pay | Admitting: Physician Assistant

## 2019-09-29 ENCOUNTER — Ambulatory Visit (INDEPENDENT_AMBULATORY_CARE_PROVIDER_SITE_OTHER): Payer: 59

## 2019-09-29 ENCOUNTER — Other Ambulatory Visit: Payer: 59

## 2019-09-29 ENCOUNTER — Other Ambulatory Visit: Payer: Self-pay

## 2019-09-29 ENCOUNTER — Ambulatory Visit: Payer: 59 | Admitting: Physician Assistant

## 2019-09-29 VITALS — BP 98/68 | HR 101 | Temp 99.4°F | Resp 18 | Wt 113.0 lb

## 2019-09-29 DIAGNOSIS — M549 Dorsalgia, unspecified: Secondary | ICD-10-CM

## 2019-09-29 DIAGNOSIS — R634 Abnormal weight loss: Secondary | ICD-10-CM

## 2019-09-29 LAB — CBC WITH DIFFERENTIAL/PLATELET
Basophils Absolute: 0 10*3/uL (ref 0.0–0.1)
Basophils Relative: 0.8 % (ref 0.0–3.0)
Eosinophils Absolute: 0.1 10*3/uL (ref 0.0–0.7)
Eosinophils Relative: 3.4 % (ref 0.0–5.0)
HCT: 41.3 % (ref 39.0–52.0)
Hemoglobin: 14.2 g/dL (ref 13.0–17.0)
Lymphocytes Relative: 20.4 % (ref 12.0–46.0)
Lymphs Abs: 0.8 10*3/uL (ref 0.7–4.0)
MCHC: 34.2 g/dL (ref 30.0–36.0)
MCV: 99.5 fl (ref 78.0–100.0)
Monocytes Absolute: 0.7 10*3/uL (ref 0.1–1.0)
Monocytes Relative: 18 % — ABNORMAL HIGH (ref 3.0–12.0)
Neutro Abs: 2.4 10*3/uL (ref 1.4–7.7)
Neutrophils Relative %: 57.4 % (ref 43.0–77.0)
Platelets: 147 10*3/uL — ABNORMAL LOW (ref 150.0–400.0)
RBC: 4.16 Mil/uL — ABNORMAL LOW (ref 4.22–5.81)
RDW: 13.3 % (ref 11.5–15.5)
WBC: 4.1 10*3/uL (ref 4.0–10.5)

## 2019-09-29 LAB — COMPREHENSIVE METABOLIC PANEL
ALT: 16 U/L (ref 0–53)
AST: 16 U/L (ref 0–37)
Albumin: 4.4 g/dL (ref 3.5–5.2)
Alkaline Phosphatase: 66 U/L (ref 39–117)
BUN: 9 mg/dL (ref 6–23)
CO2: 26 mEq/L (ref 19–32)
Calcium: 9 mg/dL (ref 8.4–10.5)
Chloride: 101 mEq/L (ref 96–112)
Creatinine, Ser: 1.02 mg/dL (ref 0.40–1.50)
GFR: 81.47 mL/min (ref >60.00)
Glucose, Bld: 89 mg/dL (ref 70–99)
Potassium: 4.4 mEq/L (ref 3.5–5.1)
Sodium: 135 mEq/L (ref 135–145)
Total Bilirubin: 0.2 mg/dL (ref 0.2–1.2)
Total Protein: 6.8 g/dL (ref 6.0–8.3)

## 2019-09-29 LAB — SEDIMENTATION RATE: Sed Rate: 13 mm/hr (ref 0–15)

## 2019-09-29 LAB — TSH: TSH: 2.76 u[IU]/mL (ref 0.35–4.50)

## 2019-09-29 MED ORDER — METHYLPREDNISOLONE ACETATE 80 MG/ML IJ SUSP
80.0000 mg | Freq: Once | INTRAMUSCULAR | Status: AC
  Administered 2019-09-29: 80 mg via INTRAMUSCULAR

## 2019-09-29 MED ORDER — METHYLPREDNISOLONE 4 MG PO TBPK: ORAL_TABLET | ORAL | 0 refills | Status: DC

## 2019-09-29 MED ORDER — CYCLOBENZAPRINE HCL 10 MG PO TABS: 10.0000 mg | ORAL_TABLET | Freq: Every day | ORAL | 0 refills | Status: DC

## 2019-09-29 NOTE — Progress Notes (Signed)
Patient presents to clinic today c/o pain starting last Wednesday.  Initially was just in his lower abdomen.  Was evaluated in the office with severe pain and sent to ER for further acute management.  Work-up including CT abdomen pelvis was unremarkable.  Was thought to potentially have a prostatitis.  As such was given course of Bactrim to take while urine cultures are pending.  Urine culture came back negative.  Patient endorses completing entire course of antibiotic.  Denies any change in symptoms with the medication.  Denies any urinary symptoms.  Pain mainly in back, between lumbar and thoracic area.  Denies radiation elsewhere.  Denies involvement of upper or lower extremities.  Denies trauma or injury.   Past Medical History:  Diagnosis Date  . Appendicitis   . Asthma   . Crohn disease ([place])   . Periodontitis     Current Outpatient Medications on File Prior to Visit  Medication Sig Dispense Refill  . beclomethasone (QVAR) 80 MCG/ACT inhaler Inhale 1 puff into the lungs 2 (two) times daily. (Patient taking differently: Inhale 1 puff into the lungs 2 (two) times daily as needed (for flares). ) 10.6 g 0  . clonazePAM (KLONOPIN) 0.5 MG tablet Take 1 tablet (0.5 mg total) by mouth 2 (two) times daily as needed. 60 tablet 0  . famotidine (PEPCID) 20 MG tablet Take 1 tablet (20 mg total) by mouth 2 (two) times daily. 60 tablet 3  . amitriptyline (ELAVIL) 25 MG tablet TAKE 1 TABLET BY MOUTH EVERYDAY AT BEDTIME 90 tablet 0  . fluticasone (FLONASE) 50 MCG/ACT nasal spray Place 2 sprays into both nostrils daily as needed for rhinitis (or seasonal allergies).     . ondansetron (ZOFRAN ODT) 8 MG disintegrating tablet Take 1 tablet (8 mg total) by mouth every 8 (eight) hours as needed for nausea or vomiting. (Patient not taking: Reported on 09/29/2019) 50 tablet 0  . pantoprazole (PROTONIX) 40 MG tablet Take 1 tablet (40 mg total) by mouth 2 (two) times daily. (Patient not taking: Reported on  09/29/2019) 60 tablet 3  . potassium chloride (K-DUR) 10 MEQ tablet Take 1 tablet (10 mEq total) by mouth 2 (two) times daily. 10 tablet 0  . potassium chloride (KLOR-CON) 10 MEQ tablet Take 1 tablet (10 mEq total) by mouth daily for 4 days. 4 tablet 0   No current facility-administered medications on file prior to visit.    Allergies  Allergen Reactions  . Contrast Media [Iodinated Diagnostic Agents] Hives and Itching    Itching , hives on 04-23-15 CT exam 05-10-15---pt got 1 hr emergent prep in ED for CT -dosed with Barium--no reaction noted  . Penicillins Other (See Comments)    From childhood- reaction not recalled Has patient had a PCN reaction causing immediate rash, facial/tongue/throat swelling, SOB or lightheadedness with hypotension: Unk Has patient had a PCN reaction causing severe rash involving mucus membranes or skin necrosis: Unk Has patient had a PCN reaction that required hospitalization: Unk Has patient had a PCN reaction occurring within the last 10 years: No If all of the above answers are "NO", then may proceed with Cephalosporin use.     Family History  Problem Relation Age of Onset  . Healthy Mother   . Breast cancer Mother   . Ovarian cancer Mother   . Colon cancer Neg Hx   . Esophageal cancer Neg Hx   . Rectal cancer Neg Hx   . Stomach cancer Neg Hx  Social History   Socioeconomic History  . Marital status: Married    Spouse name: Not on file  . Number of children: 2  . Years of education: Not on file  . Highest education level: Not on file  Occupational History  . Occupation: Hemp Farmer  Tobacco Use  . Smoking status: Former Smoker    Packs/day: 0.33    Years: 14.00    Pack years: 4.62    Types: Cigarettes    Start date: 2006    Quit date: 2020    Years since quitting: 1.3  . Smokeless tobacco: Never Used  Substance and Sexual Activity  . Alcohol use: No  . Drug use: Yes    Types: Marijuana  . Sexual activity: Yes  Other Topics  Concern  . Not on file  Social History Narrative  . Not on file   Social Determinants of Health   Financial Resource Strain:   . Difficulty of Paying Living Expenses:   Food Insecurity:   . Worried About Charity fundraiser in the Last Year:   . Arboriculturist in the Last Year:   Transportation Needs:   . Film/video editor (Medical):   Marland Kitchen Lack of Transportation (Non-Medical):   Physical Activity:   . Days of Exercise per Week:   . Minutes of Exercise per Session:   Stress:   . Feeling of Stress :   Social Connections:   . Frequency of Communication with Friends and Family:   . Frequency of Social Gatherings with Friends and Family:   . Attends Religious Services:   . Active Member of Clubs or Organizations:   . Attends Archivist Meetings:   Marland Kitchen Marital Status:     Review of Systems - See HPI.  All other ROS are negative.  BP 98/68   Pulse (!) 101   Temp 99.4 F (37.4 C) (Temporal)   Resp 18   Wt 113 lb (51.3 kg)   SpO2 98%   BMI 18.24 kg/m   Physical Exam Vitals reviewed.  Constitutional:      Appearance: Normal appearance.  HENT:     Head: Normocephalic and atraumatic.  Cardiovascular:     Rate and Rhythm: Normal rate and regular rhythm.     Pulses: Normal pulses.     Heart sounds: Normal heart sounds.  Pulmonary:     Effort: Pulmonary effort is normal.     Breath sounds: Normal breath sounds.  Abdominal:     General: Bowel sounds are normal. There is no distension.     Palpations: Abdomen is soft.     Tenderness: There is no abdominal tenderness.  Musculoskeletal:     Cervical back: Neck supple.     Thoracic back: Spasms, tenderness and bony tenderness present. No swelling, edema, deformity or signs of trauma. Normal range of motion. Scoliosis present.     Lumbar back: Spasms and tenderness present. No swelling, edema, deformity, signs of trauma or bony tenderness.  Neurological:     Mental Status: He is alert.     Recent Results  (from the past 2160 hour(s))  POCT Urinalysis Dipstick     Status: Abnormal   Collection Time: 09/19/19 11:29 AM  Result Value Ref Range   Color, UA straw    Clarity, UA clear    Glucose, UA Negative Negative   Bilirubin, UA negative    Ketones, UA negative    Spec Grav, UA <=1.005 (A) 1.010 - 1.025  Blood, UA negative    pH, UA 6.0 5.0 - 8.0   Protein, UA Negative Negative   Urobilinogen, UA 0.2 0.2 or 1.0 E.U./dL   Nitrite, UA negative    Leukocytes, UA Negative Negative   Appearance     Odor    TEST IN QUESTION AMBIGUOUS ORDER     Status: None   Collection Time: 09/19/19 11:31 AM  Result Value Ref Range   QUESTION/PROBLEM      Comment: . We are unable to ascertain the test(s) you desire from the following written order. Marland Kitchen    UNCLEAR ORDER: VERIFY TC [id number]    SPECIMEN(S) SUBMITTED UC     Comment: REQUESTED INFORMATION _________________________________ . AUTHORIZED SIGNATURE __________________________________ . TO PREVENT FURTHER DELAYS IN TESTING, PLEASE COMPLETE INFORMATION ABOVE AND EITHER FAX TO [phone] OR  EMAIL TO [email or web address] TO  RESOLVE THIS ORDER.   CBC with Differential     Status: Abnormal   Collection Time: 09/19/19 12:55 PM  Result Value Ref Range   WBC 10.1 4.0 - 10.5 K/uL   RBC 4.59 4.22 - 5.81 MIL/uL   Hemoglobin 15.6 13.0 - 17.0 g/dL   HCT 42.7 39.0 - 52.0 %   MCV 93.0 80.0 - 100.0 fL   MCH 34.0 26.0 - 34.0 pg   MCHC 36.5 (H) 30.0 - 36.0 g/dL   RDW 11.9 11.5 - 15.5 %   Platelets 207 150 - 400 K/uL   nRBC 0.0 0.0 - 0.2 %   Neutrophils Relative % 55 %   Neutro Abs 5.6 1.7 - 7.7 K/uL   Lymphocytes Relative 34 %   Lymphs Abs 3.4 0.7 - 4.0 K/uL   Monocytes Relative 10 %   Monocytes Absolute 1.0 0.1 - 1.0 K/uL   Eosinophils Relative 1 %   Eosinophils Absolute 0.1 0.0 - 0.5 K/uL   Basophils Relative 0 %   Basophils Absolute 0.0 0.0 - 0.1 K/uL   Immature Granulocytes 0 %   Abs Immature Granulocytes 0.01 0.00 - 0.07  K/uL    Comment: Performed at [facility], [address] [place], [place] [address]  Comprehensive metabolic panel     Status: Abnormal   Collection Time: 09/19/19 12:55 PM  Result Value Ref Range   Sodium 136 135 - 145 mmol/L   Potassium 3.0 (L) 3.5 - 5.1 mmol/L   Chloride 93 (L) 98 - 111 mmol/L   CO2 29 22 - 32 mmol/L   Glucose, Bld 97 70 - 99 mg/dL    Comment: Glucose reference range applies only to samples taken after fasting for at least 8 hours.   BUN 11 6 - 20 mg/dL   Creatinine, Ser 1.01 0.61 - 1.24 mg/dL   Calcium 9.5 8.9 - 10.3 mg/dL   Total Protein 7.9 6.5 - 8.1 g/dL   Albumin 5.1 (H) 3.5 - 5.0 g/dL   AST 17 15 - 41 U/L   ALT 21 0 - 44 U/L   Alkaline Phosphatase 56 38 - 126 U/L   Total Bilirubin 0.7 0.3 - 1.2 mg/dL   GFR calc non Af Amer >60 >60 mL/min   GFR calc Af Amer >60 >60 mL/min   Anion gap 14 5 - 15    Comment: Performed at [facility], [address] [place], [place] [address]  Urinalysis, Routine w reflex microscopic     Status: Abnormal   Collection Time: 09/19/19 12:55 PM  Result Value Ref Range   Color, Urine YELLOW  YELLOW   APPearance CLEAR CLEAR   Specific Gravity, Urine 1.010 1.005 - 1.030   pH 6.0 5.0 - 8.0   Glucose, UA NEGATIVE NEGATIVE mg/dL   Hgb urine dipstick NEGATIVE NEGATIVE   Bilirubin Urine NEGATIVE NEGATIVE   Ketones, ur 15 (A) NEGATIVE mg/dL   Protein, ur NEGATIVE NEGATIVE mg/dL   Nitrite NEGATIVE NEGATIVE   Leukocytes,Ua NEGATIVE NEGATIVE    Comment: Microscopic not done on urines with negative protein, blood, leukocytes, nitrite, or glucose < 500 mg/dL. Performed at [facility], [address] [place], [place] [address]   Urine rapid drug screen (hosp performed)     Status: Abnormal   Collection Time: 09/19/19 12:55 PM  Result Value Ref Range   Opiates NONE DETECTED NONE DETECTED   Cocaine NONE DETECTED NONE DETECTED   Benzodiazepines NONE DETECTED NONE DETECTED   Amphetamines NONE  DETECTED NONE DETECTED   Tetrahydrocannabinol POSITIVE (A) NONE DETECTED   Barbiturates NONE DETECTED NONE DETECTED    Comment: (NOTE) DRUG SCREEN FOR MEDICAL PURPOSES ONLY.  IF CONFIRMATION IS NEEDED FOR ANY PURPOSE, NOTIFY LAB WITHIN 5 DAYS. LOWEST DETECTABLE LIMITS FOR URINE DRUG SCREEN Drug Class                     Cutoff (ng/mL) Amphetamine and metabolites    1000 Barbiturate and metabolites    200 Benzodiazepine                 833 Tricyclics and metabolites     300 Opiates and metabolites        300 Cocaine and metabolites        300 THC                            50 Performed at [facility], [address], [place], [place] [address]   Urine culture     Status: None   Collection Time: 09/19/19 12:55 PM   Specimen: Urine, Random  Result Value Ref Range   Specimen Description      URINE, RANDOM Performed at [facility], [address] [place], [place] [address]    Special Requests      NONE Performed at [facility], [address] [place], [place] [address]    Culture      NO GROWTH Performed at [facility], [place] [address], [place], [place] [address]    Report Status 09/20/2019 FINAL   Magnesium     Status: None   Collection Time: 09/19/19 12:55 PM  Result Value Ref Range   Magnesium 2.1 1.7 - 2.4 mg/dL    Comment: Performed at [facility], [address] [place], [place] [address]    Assessment/Plan: 1. Severe back pain Atraumatic with patient with tenderness with palpation and percussion of the lower thoracic vertebrae.  No bony abnormalities noted.  Some tenderness of insertion of trapezius on the spine with notable spasm, left greater than right.  Will obtain x-ray of thoracic and lumbar spine today for further assessment.  Concern for nerve irritation in addition to muscle tension and spasm.  80 mg Depo-Medrol given in office today.  Will start Medrol pack tomorrow, taking as directed.  Rx Flexeril.   Supportive measures reviewed with patient.  Close follow-up to be scheduled.  May need to proceed with further imaging or sports medicine assessment if  symptoms not improving, especially since work-up for abdominal or urological causes have been negative. - DG Thoracic Spine W/Swimmers; Future - DG Lumbar Spine Complete; Future - cyclobenzaprine (FLEXERIL) 10 MG tablet; Take 1 tablet (10 mg total) by mouth at bedtime.  Dispense: 30 tablet; Refill: 0 - methylPREDNISolone (MEDROL DOSEPAK) 4 MG TBPK tablet; Take following package directions  Dispense: 21 tablet; Refill: 0 - methylPREDNISolone acetate (DEPO-MEDROL) injection 80 mg  2. Weight loss Ongoing.  Was unable to assess further at last visit due to ER disposition.  We will repeat CBC and CMP.  We will also check TSH, sed rate and TB test.  If unremarkable we will have him follow-up with gastroenterology for consideration of colonoscopy. - Comp Met (CMET) - CBC w/Diff - TSH - Sedimentation rate - QuantiFERON-TB Gold Plus  This visit occurred during the SARS-CoV-2 public health emergency.  Safety protocols were in place, including screening questions prior to the visit, additional usage of staff PPE, and extensive cleaning of exam room while observing appropriate contact time as indicated for disinfecting solutions.     [name], PA-C

## 2019-09-29 NOTE — Patient Instructions (Signed)
Please call a friend to speak with Tammy or Danae Chen to get your x-ray scheduled at our [facility] office.  Please go to the [facility] office for x-ray. We will call you with your results and alter treatment accordingly.   [address]  [place], [place] [address]  The steroid shot given today should start alleviating some of your symptoms. Start the steroid pack given tomorrow morning.  Take as directed. Take the Flexeril as directed, starting this evening. Tylenol for breakthrough pain along with the steroid given.  Once we have x-ray results hopefully will get some further information. We may have to proceed with further imaging including MRI.  If you note any acute worsening of symptoms, any lower extremity weakness or numbness in the groin/change in bowel or bladder habits, please seek care at the emergency room.

## 2019-09-29 NOTE — Telephone Encounter (Signed)
Patient seen in office today.

## 2019-10-01 LAB — QUANTIFERON-TB GOLD PLUS
Mitogen-NIL: >10 IU/mL
NIL: 0.12 IU/mL
QuantiFERON-TB Gold Plus: NEGATIVE
TB1-NIL: 0.01 IU/mL
TB2-NIL: <0 IU/mL

## 2019-10-05 ENCOUNTER — Telehealth: Payer: Self-pay | Admitting: Physician Assistant

## 2019-10-05 ENCOUNTER — Telehealth: Payer: Self-pay | Admitting: Gastroenterology

## 2019-10-05 DIAGNOSIS — R1115 Cyclical vomiting syndrome unrelated to migraine: Secondary | ICD-10-CM

## 2019-10-05 MED ORDER — ONDANSETRON 8 MG PO TBDP: 8.0000 mg | ORAL_TABLET | Freq: Three times a day (TID) | ORAL | 0 refills | Status: DC | PRN

## 2019-10-05 NOTE — Telephone Encounter (Signed)
Spoke with PCP and ok the refill of the Zofran. Patient called GI today and has been scheduled for an appointment 10/18/19 @ 2p Rx sent to the pharmacy

## 2019-10-05 NOTE — Telephone Encounter (Signed)
Pt states he has lost 20 pounds in a week. Per vitals in chart he has not. Pt states he is having lots of N&V and requesting an appt. Pt scheduled to see Keenan Bachelor NP 6/1@2pm . Pt aware of appt.

## 2019-10-05 NOTE — Telephone Encounter (Signed)
Patient called states he lost about 25 lbs has a lot of nausea\vomiting please advise

## 2019-10-05 NOTE — Telephone Encounter (Signed)
Patient is requesting a refill on zofran.  States that he is throwing up and needs this to keep his food down.   States that this is something that [place] normally prescribes for this issue.  [facility] uses [facility]

## 2019-10-06 ENCOUNTER — Ambulatory Visit: Payer: 59 | Admitting: Physician Assistant

## 2019-10-06 ENCOUNTER — Encounter: Payer: Self-pay | Admitting: Physician Assistant

## 2019-10-06 ENCOUNTER — Other Ambulatory Visit: Payer: Self-pay

## 2019-10-06 VITALS — BP 110/70 | HR 98 | Temp 99.3°F | Resp 14 | Ht 66.0 in | Wt 110.0 lb

## 2019-10-06 DIAGNOSIS — K5792 Diverticulitis of intestine, part unspecified, without perforation or abscess without bleeding: Secondary | ICD-10-CM

## 2019-10-06 MED ORDER — METRONIDAZOLE 500 MG PO TABS: 500.0000 mg | ORAL_TABLET | Freq: Three times a day (TID) | ORAL | 0 refills | Status: DC

## 2019-10-06 MED ORDER — CIPROFLOXACIN HCL 500 MG PO TABS: 500.0000 mg | ORAL_TABLET | Freq: Two times a day (BID) | ORAL | 0 refills | Status: DC

## 2019-10-06 NOTE — Patient Instructions (Signed)
I am going to avoid re-scanning you as you have had x-ray, [place] and CT scans recently that were unremarkable. I do see prior diverticulosis noted and giving L-sided symptoms there is concern for mild diverticulitis and colitis.   I am starting you on a combination of Cipro and Flagyl. Take as directed with food. Continue the Zofran and other chronic medications. We are going to work on getting you in with GI sooner. Please keep your phone on.   If anything worsens, please seek ER assessment.

## 2019-10-06 NOTE — Progress Notes (Signed)
Patient presents to clinic today for follow-up of thoracic/flank pain.  Patient previously been assessed for this in both clinic here and at the emergency department.  At initial presentation here in the office pain was severe.  As such he was sent to the ER for further evaluation.  Work-up in the ER including CT abdomen and pelvis, as well as scrotal ultrasound were unremarkable for a cause of his symptoms.  EDP felt patient likely had prostatitis.  Was started on antibiotic therapy while awaiting culture results.  Culture was negative.  Patient took antibiotics without any improvement.  On follow-up with me on 09/29/2019 symptoms felt to be likely musculoskeletal in nature giving tenderness to palpation on examination and with range of motion.  As such patient was started on combination of muscle relaxant and Medrol Dosepak (patient cannot tolerate NSAIDs due to GI history).  Patient endorses taking medications as directed without any noted improvement in symptoms.  States the pain is the same, although left-sided between upper lumbar region to lower rib cage.  Does radiate around to his abdomen.  Denies any change to bowel habits.  Denies dysuria, urinary urgency or frequency.  Still denies fever, chills.  Continued nausea with decreased appetite.  Patient has follow-up with his gastroenterologist for further evaluation of these ongoing symptoms, but is not until June.  Past Medical History:  Diagnosis Date  . Appendicitis   . Asthma   . Crohn disease ([place])   . Periodontitis     Current Outpatient Medications on File Prior to Visit  Medication Sig Dispense Refill  . amitriptyline (ELAVIL) 25 MG tablet TAKE 1 TABLET BY MOUTH EVERYDAY AT BEDTIME 90 tablet 0  . beclomethasone (QVAR) 80 MCG/ACT inhaler Inhale 1 puff into the lungs 2 (two) times daily. (Patient taking differently: Inhale 1 puff into the lungs 2 (two) times daily as needed (for flares). ) 10.6 g 0  . clonazePAM (KLONOPIN) 0.5 MG tablet  Take 1 tablet (0.5 mg total) by mouth 2 (two) times daily as needed. 60 tablet 0  . cyclobenzaprine (FLEXERIL) 10 MG tablet Take 1 tablet (10 mg total) by mouth at bedtime. 30 tablet 0  . famotidine (PEPCID) 20 MG tablet Take 1 tablet (20 mg total) by mouth 2 (two) times daily. 60 tablet 3  . fluticasone (FLONASE) 50 MCG/ACT nasal spray Place 2 sprays into both nostrils daily as needed for rhinitis (or seasonal allergies).     . ondansetron (ZOFRAN ODT) 8 MG disintegrating tablet Take 1 tablet (8 mg total) by mouth every 8 (eight) hours as needed for nausea or vomiting. 50 tablet 0  . potassium chloride (K-DUR) 10 MEQ tablet Take 1 tablet (10 mEq total) by mouth 2 (two) times daily. 10 tablet 0  . pantoprazole (PROTONIX) 40 MG tablet Take 1 tablet (40 mg total) by mouth 2 (two) times daily. (Patient not taking: Reported on 09/29/2019) 60 tablet 3   No current facility-administered medications on file prior to visit.    Allergies  Allergen Reactions  . Contrast Media [Iodinated Diagnostic Agents] Hives and Itching    Itching , hives on 04-23-15 CT exam 05-10-15---pt got 1 hr emergent prep in ED for CT -dosed with Barium--no reaction noted  . Penicillins Other (See Comments)    From childhood- reaction not recalled Has patient had a PCN reaction causing immediate rash, facial/tongue/throat swelling, SOB or lightheadedness with hypotension: Unk Has patient had a PCN reaction causing severe rash involving mucus membranes or skin necrosis: Unk  Has patient had a PCN reaction that required hospitalization: Unk Has patient had a PCN reaction occurring within the last 10 years: No If all of the above answers are "NO", then may proceed with Cephalosporin use.     Family History  Problem Relation Age of Onset  . Healthy Mother   . Breast cancer Mother   . Ovarian cancer Mother   . Colon cancer Neg Hx   . Esophageal cancer Neg Hx   . Rectal cancer Neg Hx   . Stomach cancer Neg Hx     Social  History   Socioeconomic History  . Marital status: Married    Spouse name: Not on file  . Number of children: 2  . Years of education: Not on file  . Highest education level: Not on file  Occupational History  . Occupation: Hemp Farmer  Tobacco Use  . Smoking status: Former Smoker    Packs/day: 0.33    Years: 14.00    Pack years: 4.62    Types: Cigarettes    Start date: 2006    Quit date: 2020    Years since quitting: 1.3  . Smokeless tobacco: Never Used  Substance and Sexual Activity  . Alcohol use: No  . Drug use: Yes    Types: Marijuana  . Sexual activity: Yes  Other Topics Concern  . Not on file  Social History Narrative  . Not on file   Social Determinants of Health   Financial Resource Strain:   . Difficulty of Paying Living Expenses:   Food Insecurity:   . Worried About Charity fundraiser in the Last Year:   . Arboriculturist in the Last Year:   Transportation Needs:   . Film/video editor (Medical):   Marland Kitchen Lack of Transportation (Non-Medical):   Physical Activity:   . Days of Exercise per Week:   . Minutes of Exercise per Session:   Stress:   . Feeling of Stress :   Social Connections:   . Frequency of Communication with Friends and Family:   . Frequency of Social Gatherings with Friends and Family:   . Attends Religious Services:   . Active Member of Clubs or Organizations:   . Attends Archivist Meetings:   Marland Kitchen Marital Status:     Review of Systems - See HPI.  All other ROS are negative.  BP 110/70   Pulse 98   Temp 99.3 F (37.4 C) (Oral)   Resp 14   Ht 5' 6" (1.676 m)   Wt 110 lb (49.9 kg)   SpO2 98%   BMI 17.75 kg/m   Physical Exam Vitals reviewed.  Constitutional:      Appearance: Normal appearance.  HENT:     Head: Normocephalic and atraumatic.     Right Ear: Tympanic membrane normal.     Left Ear: Tympanic membrane normal.  Eyes:     Conjunctiva/sclera: Conjunctivae normal.     Pupils: Pupils are equal, round, and  reactive to light.  Cardiovascular:     Rate and Rhythm: Normal rate and regular rhythm.     Pulses: Normal pulses.     Heart sounds: Normal heart sounds.  Abdominal:     General: Bowel sounds are normal. There is no distension.     Palpations: Abdomen is soft. There is no mass.     Tenderness: There is abdominal tenderness (Positive left upper quadrant and left lower quadrant tenderness on examination without rebound, guarding or mass.).  There is no right CVA tenderness, left CVA tenderness, guarding or rebound.     Hernia: No hernia is present.  Musculoskeletal:     Cervical back: Normal and neck supple.     Thoracic back: Tenderness (Continued thoracic left paraspinal muscular tenderness to palpation.  Pain also reproduced with range of motion) present. No spasms. Normal range of motion. Scoliosis (Chronic.  Mild) present.     Lumbar back: Normal.  Neurological:     Mental Status: He is alert.     Recent Results (from the past 2160 hour(s))  POCT Urinalysis Dipstick     Status: Abnormal   Collection Time: 09/19/19 11:29 AM  Result Value Ref Range   Color, UA straw    Clarity, UA clear    Glucose, UA Negative Negative   Bilirubin, UA negative    Ketones, UA negative    Spec Grav, UA <=1.005 (A) 1.010 - 1.025   Blood, UA negative    pH, UA 6.0 5.0 - 8.0   Protein, UA Negative Negative   Urobilinogen, UA 0.2 0.2 or 1.0 E.U./dL   Nitrite, UA negative    Leukocytes, UA Negative Negative   Appearance     Odor    TEST IN QUESTION AMBIGUOUS ORDER     Status: None   Collection Time: 09/19/19 11:31 AM  Result Value Ref Range   QUESTION/PROBLEM      Comment: . We are unable to ascertain the test(s) you desire from the following written order. Marland Kitchen    UNCLEAR ORDER: VERIFY TC [id number]    SPECIMEN(S) SUBMITTED UC     Comment: REQUESTED INFORMATION _________________________________ . AUTHORIZED SIGNATURE __________________________________ . TO PREVENT FURTHER DELAYS IN TESTING,  PLEASE COMPLETE INFORMATION ABOVE AND EITHER FAX TO [phone] OR  EMAIL TO [email or web address] TO  RESOLVE THIS ORDER.   CBC with Differential     Status: Abnormal   Collection Time: 09/19/19 12:55 PM  Result Value Ref Range   WBC 10.1 4.0 - 10.5 K/uL   RBC 4.59 4.22 - 5.81 MIL/uL   Hemoglobin 15.6 13.0 - 17.0 g/dL   HCT 42.7 39.0 - 52.0 %   MCV 93.0 80.0 - 100.0 fL   MCH 34.0 26.0 - 34.0 pg   MCHC 36.5 (H) 30.0 - 36.0 g/dL   RDW 11.9 11.5 - 15.5 %   Platelets 207 150 - 400 K/uL   nRBC 0.0 0.0 - 0.2 %   Neutrophils Relative % 55 %   Neutro Abs 5.6 1.7 - 7.7 K/uL   Lymphocytes Relative 34 %   Lymphs Abs 3.4 0.7 - 4.0 K/uL   Monocytes Relative 10 %   Monocytes Absolute 1.0 0.1 - 1.0 K/uL   Eosinophils Relative 1 %   Eosinophils Absolute 0.1 0.0 - 0.5 K/uL   Basophils Relative 0 %   Basophils Absolute 0.0 0.0 - 0.1 K/uL   Immature Granulocytes 0 %   Abs Immature Granulocytes 0.01 0.00 - 0.07 K/uL    Comment: Performed at [facility], [address] [place], [place] [address]  Comprehensive metabolic panel     Status: Abnormal   Collection Time: 09/19/19 12:55 PM  Result Value Ref Range   Sodium 136 135 - 145 mmol/L   Potassium 3.0 (L) 3.5 - 5.1 mmol/L   Chloride 93 (L) 98 - 111 mmol/L   CO2 29 22 - 32 mmol/L   Glucose, Bld 97 70 - 99 mg/dL    Comment: Glucose reference range applies only  to samples taken after fasting for at least 8 hours.   BUN 11 6 - 20 mg/dL   Creatinine, Ser 1.01 0.61 - 1.24 mg/dL   Calcium 9.5 8.9 - 10.3 mg/dL   Total Protein 7.9 6.5 - 8.1 g/dL   Albumin 5.1 (H) 3.5 - 5.0 g/dL   AST 17 15 - 41 U/L   ALT 21 0 - 44 U/L   Alkaline Phosphatase 56 38 - 126 U/L   Total Bilirubin 0.7 0.3 - 1.2 mg/dL   GFR calc non Af Amer >60 >60 mL/min   GFR calc Af Amer >60 >60 mL/min   Anion gap 14 5 - 15    Comment: Performed at [facility], [address] [place], [place] [address]  Urinalysis, Routine w reflex  microscopic     Status: Abnormal   Collection Time: 09/19/19 12:55 PM  Result Value Ref Range   Color, Urine YELLOW YELLOW   APPearance CLEAR CLEAR   Specific Gravity, Urine 1.010 1.005 - 1.030   pH 6.0 5.0 - 8.0   Glucose, UA NEGATIVE NEGATIVE mg/dL   Hgb urine dipstick NEGATIVE NEGATIVE   Bilirubin Urine NEGATIVE NEGATIVE   Ketones, ur 15 (A) NEGATIVE mg/dL   Protein, ur NEGATIVE NEGATIVE mg/dL   Nitrite NEGATIVE NEGATIVE   Leukocytes,Ua NEGATIVE NEGATIVE    Comment: Microscopic not done on urines with negative protein, blood, leukocytes, nitrite, or glucose < 500 mg/dL. Performed at [facility], [address] [place], [place] [address]   Urine rapid drug screen (hosp performed)     Status: Abnormal   Collection Time: 09/19/19 12:55 PM  Result Value Ref Range   Opiates NONE DETECTED NONE DETECTED   Cocaine NONE DETECTED NONE DETECTED   Benzodiazepines NONE DETECTED NONE DETECTED   Amphetamines NONE DETECTED NONE DETECTED   Tetrahydrocannabinol POSITIVE (A) NONE DETECTED   Barbiturates NONE DETECTED NONE DETECTED    Comment: (NOTE) DRUG SCREEN FOR MEDICAL PURPOSES ONLY.  IF CONFIRMATION IS NEEDED FOR ANY PURPOSE, NOTIFY LAB WITHIN 5 DAYS. LOWEST DETECTABLE LIMITS FOR URINE DRUG SCREEN Drug Class                     Cutoff (ng/mL) Amphetamine and metabolites    1000 Barbiturate and metabolites    200 Benzodiazepine                 644 Tricyclics and metabolites     300 Opiates and metabolites        300 Cocaine and metabolites        300 THC                            50 Performed at [facility], [address] [place], [place] [address]   Urine culture     Status: None   Collection Time: 09/19/19 12:55 PM   Specimen: Urine, Random  Result Value Ref Range   Specimen Description      URINE, RANDOM Performed at [facility], [address] [place], [place] [address]    Special Requests      NONE Performed at [facility], [address] [place], [place] [address]    Culture      NO GROWTH Performed at [facility], [address] [place], [place] [address]    Report Status 09/20/2019 FINAL  Magnesium     Status: None   Collection Time: 09/19/19 12:55 PM  Result Value Ref Range   Magnesium 2.1 1.7 - 2.4 mg/dL    Comment: Performed at [facility], [address] [place], [place] [address]  Comp Met (CMET)     Status: None   Collection Time: 09/29/19 11:13 AM  Result Value Ref Range   Sodium 135 135 - 145 mEq/L   Potassium 4.4 3.5 - 5.1 mEq/L   Chloride 101 96 - 112 mEq/L   CO2 26 19 - 32 mEq/L   Glucose, Bld 89 70 - 99 mg/dL   BUN 9 6 - 23 mg/dL   Creatinine, Ser 1.02 0.40 - 1.50 mg/dL   Total Bilirubin 0.2 0.2 - 1.2 mg/dL   Alkaline Phosphatase 66 39 - 117 U/L   AST 16 0 - 37 U/L   ALT 16 0 - 53 U/L   Total Protein 6.8 6.0 - 8.3 g/dL   Albumin 4.4 3.5 - 5.2 g/dL   GFR 81.47 >60.00 mL/min   Calcium 9.0 8.4 - 10.5 mg/dL  CBC w/Diff     Status: Abnormal   Collection Time: 09/29/19 11:13 AM  Result Value Ref Range   WBC 4.1 4.0 - 10.5 K/uL   RBC 4.16 (L) 4.22 - 5.81 Mil/uL   Hemoglobin 14.2 13.0 - 17.0 g/dL   HCT 41.3 39.0 - 52.0 %   MCV 99.5 78.0 - 100.0 fl   MCHC 34.2 30.0 - 36.0 g/dL   RDW 13.3 11.5 - 15.5 %   Platelets 147.0 (L) 150.0 - 400.0 K/uL   Neutrophils Relative % 57.4 43.0 - 77.0 %   Lymphocytes Relative 20.4 12.0 - 46.0 %   Monocytes Relative 18.0 (H) 3.0 - 12.0 %   Eosinophils Relative 3.4 0.0 - 5.0 %   Basophils Relative 0.8 0.0 - 3.0 %   Neutro Abs 2.4 1.4 - 7.7 K/uL   Lymphs Abs 0.8 0.7 - 4.0 K/uL   Monocytes Absolute 0.7 0.1 - 1.0 K/uL   Eosinophils Absolute 0.1 0.0 - 0.7 K/uL   Basophils Absolute 0.0 0.0 - 0.1 K/uL  TSH     Status: None   Collection Time: 09/29/19 11:13 AM  Result Value Ref Range   TSH 2.76 0.35 - 4.50 uIU/mL  Sedimentation rate     Status: None   Collection Time: 09/29/19 11:13 AM  Result Value Ref Range   Sed  Rate 13 0 - 15 mm/hr  QuantiFERON-TB Gold Plus     Status: None   Collection Time: 09/29/19 11:15 AM  Result Value Ref Range   QuantiFERON-TB Gold Plus NEGATIVE NEGATIVE    Comment: Negative test result. M. tuberculosis complex  infection unlikely.    NIL 0.12 IU/mL   Mitogen-NIL >10.00 IU/mL   TB1-NIL 0.01 IU/mL   TB2-NIL <0.00 IU/mL    Comment: . The Nil tube value reflects the background interferon gamma immune response of the patient's blood sample. This value has been subtracted from the patient's displayed TB and Mitogen results. . Lower than expected results with the Mitogen tube prevent false-negative Quantiferon readings by detecting a patient with a potential immune suppressive condition and/or suboptimal pre-analytical specimen handling. . The TB1 Antigen tube is coated with the M. tuberculosis-specific antigens designed to elicit responses from TB antigen primed CD4+ helper T-lymphocytes. . The TB2 Antigen tube is coated with the M. tuberculosis-specific antigens designed to elicit responses from TB antigen primed CD4+ helper and CD8+  cytotoxic T-lymphocytes. . For additional information, please refer to https://education.questdiagnostics.com/faq/FAQ204 (This link is being provided for informational/ educational purposes only.) .     Assessment/Plan: 1. Diverticulitis In the context of negative work-up thus far including labs, urine testing, CT and ultrasonography and lack of response to treatment by EDP for suspected prostatitis, and lack of response to treatment for musculoskeletal etiology.  Discussed full history and current presentation with supervising MD, Annye Asa.  CT incidentally noted left-sided diverticulosis.  Giving significant left-sided abdominal tenderness will start patient on course of Cipro and Flagyl for potential mild diverticular disease or colitis.  MD in agreement.  Patient to take medications as directed.  Continue chronic  medications.  Increase fluids.  Strict ER precautions reviewed with patient.  He is going to call his GI provider to see if he can be seen sooner.  He is to let us know if he has issue with this and maybe we can help. - ciprofloxacin (CIPRO) 500 MG tablet; Take 1 tablet (500 mg total) by mouth 2 (two) times daily.  Dispense: 14 tablet; Refill: 0 - metroNIDAZOLE (FLAGYL) 500 MG tablet; Take 1 tablet (500 mg total) by mouth 3 (three) times daily.  Dispense: 21 tablet; Refill: 0  This visit occurred during the SARS-CoV-2 public health emergency.  Safety protocols were in place, including screening questions prior to the visit, additional usage of staff PPE, and extensive cleaning of exam room while observing appropriate contact time as indicated for disinfecting solutions.    [name], PA-C

## 2019-10-18 ENCOUNTER — Ambulatory Visit: Payer: 59 | Admitting: Nurse Practitioner

## 2019-10-26 ENCOUNTER — Other Ambulatory Visit: Payer: Self-pay | Admitting: Physician Assistant

## 2019-10-26 DIAGNOSIS — F411 Generalized anxiety disorder: Secondary | ICD-10-CM

## 2019-10-26 NOTE — Telephone Encounter (Signed)
Last OV 10/06/19 Clonazepam last filled 08/22/19 #60 with 0

## 2019-10-27 MED ORDER — CLONAZEPAM 0.5 MG PO TABS: 0.5000 mg | ORAL_TABLET | Freq: Two times a day (BID) | ORAL | 0 refills | Status: DC | PRN

## 2019-10-28 ENCOUNTER — Ambulatory Visit: Payer: 59 | Admitting: Nurse Practitioner

## 2019-11-08 ENCOUNTER — Ambulatory Visit: Payer: 59 | Admitting: Physician Assistant

## 2020-01-12 ENCOUNTER — Encounter: Payer: Self-pay | Admitting: Physician Assistant

## 2020-01-12 ENCOUNTER — Other Ambulatory Visit: Payer: Self-pay | Admitting: Emergency Medicine

## 2020-01-12 DIAGNOSIS — F411 Generalized anxiety disorder: Secondary | ICD-10-CM

## 2020-01-12 MED ORDER — CLONAZEPAM 0.5 MG PO TABS: 0.5000 mg | ORAL_TABLET | Freq: Two times a day (BID) | ORAL | 0 refills | Status: DC | PRN

## 2020-01-12 NOTE — Telephone Encounter (Signed)
Would need video appointment for further assessment.

## 2020-01-12 NOTE — Telephone Encounter (Signed)
If just nasal congestion and rhinorrhea, likely allergy-related, especially in the absence of any other symptoms. Should be using OTC nasal steroid -- Flonase or Nasacort and a daily antihistamine.

## 2020-01-12 NOTE — Telephone Encounter (Signed)
Clonazepam last rx 10/27/19 #60 LOV: 10/06/19 Diverticulitis CSC: 05/07/18

## 2020-01-30 ENCOUNTER — Encounter: Payer: Self-pay | Admitting: Physician Assistant

## 2020-01-31 ENCOUNTER — Other Ambulatory Visit: Payer: Self-pay

## 2020-01-31 ENCOUNTER — Other Ambulatory Visit: Payer: 59

## 2020-01-31 DIAGNOSIS — Z20822 Contact with and (suspected) exposure to covid-19: Secondary | ICD-10-CM

## 2020-02-02 LAB — NOVEL CORONAVIRUS, NAA: SARS-CoV-2, NAA: NOT DETECTED

## 2020-02-02 LAB — SARS-COV-2, NAA 2 DAY TAT

## 2020-04-02 ENCOUNTER — Other Ambulatory Visit: Payer: Self-pay | Admitting: Physician Assistant

## 2020-04-02 ENCOUNTER — Encounter: Payer: Self-pay | Admitting: Physician Assistant

## 2020-04-02 DIAGNOSIS — F411 Generalized anxiety disorder: Secondary | ICD-10-CM

## 2020-04-02 MED ORDER — BECLOMETHASONE DIPROP HFA 80 MCG/ACT IN AERB: 1.0000 | INHALATION_SPRAY | Freq: Two times a day (BID) | RESPIRATORY_TRACT | 3 refills | Status: DC

## 2020-04-02 NOTE — Telephone Encounter (Signed)
This was denied earlier. He needs follow-up appointment.

## 2020-04-02 NOTE — Telephone Encounter (Signed)
Clonazepam last rx 01/12/20 #60 LOV: 10/01/18 Anxiety, LOV: 10/06/19 Diverticulitis CSC: 05/07/18

## 2020-04-06 ENCOUNTER — Other Ambulatory Visit: Payer: Self-pay

## 2020-04-06 ENCOUNTER — Telehealth (INDEPENDENT_AMBULATORY_CARE_PROVIDER_SITE_OTHER): Payer: 59 | Admitting: Physician Assistant

## 2020-04-06 ENCOUNTER — Encounter: Payer: Self-pay | Admitting: Physician Assistant

## 2020-04-06 DIAGNOSIS — F411 Generalized anxiety disorder: Secondary | ICD-10-CM

## 2020-04-06 MED ORDER — CLONAZEPAM 0.5 MG PO TABS: 0.5000 mg | ORAL_TABLET | Freq: Two times a day (BID) | ORAL | 0 refills | Status: DC | PRN

## 2020-04-06 NOTE — Progress Notes (Signed)
Virtual Visit via Video   I connected with patient on 04/06/20 at  8:00 AM EST by a video enabled telemedicine application and verified that I am speaking with the correct person using two identifiers.  Location patient: Home Location provider: [facility], Office Persons participating in the virtual visit: Patient, Provider, [place] (Patina Moore)  I discussed the limitations of evaluation and management by telemedicine and the availability of in person appointments. The patient expressed understanding and agreed to proceed.  Subjective:   HPI:   Patient presents via [place] today for follow-up of anxiety. Patient is currently on a regimen of Clonazepam using every 3 days. Notes overall no generalized anxiety at present. Denies depressed mood or anhedonia.   ROS:   See pertinent positives and negatives per HPI.  Patient Active Problem List   Diagnosis Date Noted  . GAD (generalized anxiety disorder) 01/06/2018  . Acute appendicitis 04/23/2015    Social History   Tobacco Use  . Smoking status: Former Smoker    Packs/day: 0.33    Years: 14.00    Pack years: 4.62    Types: Cigarettes    Start date: 2006    Quit date: 2020    Years since quitting: 1.8  . Smokeless tobacco: Never Used  Substance Use Topics  . Alcohol use: No    Current Outpatient Medications:  .  amitriptyline (ELAVIL) 25 MG tablet, TAKE 1 TABLET BY MOUTH EVERYDAY AT BEDTIME, Disp: 90 tablet, Rfl: 0 .  beclomethasone (QVAR) 80 MCG/ACT inhaler, Inhale 1 puff into the lungs 2 (two) times daily., Disp: 10.6 g, Rfl: 3 .  ciprofloxacin (CIPRO) 500 MG tablet, Take 1 tablet (500 mg total) by mouth 2 (two) times daily., Disp: 14 tablet, Rfl: 0 .  clonazePAM (KLONOPIN) 0.5 MG tablet, Take 1 tablet (0.5 mg total) by mouth 2 (two) times daily as needed., Disp: 60 tablet, Rfl: 0 .  cyclobenzaprine (FLEXERIL) 10 MG tablet, Take 1 tablet (10 mg total) by mouth at bedtime., Disp: 30 tablet, Rfl: 0 .   famotidine (PEPCID) 20 MG tablet, Take 1 tablet (20 mg total) by mouth 2 (two) times daily., Disp: 60 tablet, Rfl: 3 .  fluticasone (FLONASE) 50 MCG/ACT nasal spray, Place 2 sprays into both nostrils daily as needed for rhinitis (or seasonal allergies). , Disp: , Rfl:  .  metroNIDAZOLE (FLAGYL) 500 MG tablet, Take 1 tablet (500 mg total) by mouth 3 (three) times daily., Disp: 21 tablet, Rfl: 0 .  ondansetron (ZOFRAN ODT) 8 MG disintegrating tablet, Take 1 tablet (8 mg total) by mouth every 8 (eight) hours as needed for nausea or vomiting., Disp: 50 tablet, Rfl: 0 .  pantoprazole (PROTONIX) 40 MG tablet, Take 1 tablet (40 mg total) by mouth 2 (two) times daily. (Patient not taking: Reported on 09/29/2019), Disp: 60 tablet, Rfl: 3 .  potassium chloride (K-DUR) 10 MEQ tablet, Take 1 tablet (10 mEq total) by mouth 2 (two) times daily., Disp: 10 tablet, Rfl: 0  Allergies  Allergen Reactions  . Contrast Media [Iodinated Diagnostic Agents] Hives and Itching    Itching , hives on 04-23-15 CT exam 05-10-15---pt got 1 hr emergent prep in ED for CT -dosed with Barium--no reaction noted  . Penicillins Other (See Comments)    From childhood- reaction not recalled Has patient had a PCN reaction causing immediate rash, facial/tongue/throat swelling, SOB or lightheadedness with hypotension: Unk Has patient had a PCN reaction causing severe rash involving mucus membranes or skin necrosis: Unk Has patient  had a PCN reaction that required hospitalization: Unk Has patient had a PCN reaction occurring within the last 10 years: No If all of the above answers are "NO", then may proceed with Cephalosporin use.     Objective:   There were no vitals taken for this visit.  Patient is well-developed, well-nourished in no acute distress.  Resting comfortably at home.  Head is normocephalic, atraumatic.  No labored breathing.  Speech is clear and coherent with logical content.  Patient is alert and oriented at  baseline.   Assessment and Plan:   1. Anxiety state Doing very well overall. Occasional use of Klonopin with good relief. Will continue current regimen. PDMP reviewed. No red flags.      [name], PA-C 04/06/2020

## 2020-04-06 NOTE — Progress Notes (Signed)
I have discussed the procedure for the virtual visit with the patient who has given consent to proceed with assessment and treatment.   Paul Arnold S Sylvestre Rathgeber, CMA     

## 2020-07-13 ENCOUNTER — Encounter: Payer: Self-pay | Admitting: Physician Assistant

## 2020-07-13 DIAGNOSIS — F411 Generalized anxiety disorder: Secondary | ICD-10-CM

## 2020-07-16 MED ORDER — CLONAZEPAM 0.5 MG PO TABS: 0.5000 mg | ORAL_TABLET | Freq: Two times a day (BID) | ORAL | 0 refills | Status: DC | PRN

## 2020-07-16 NOTE — Telephone Encounter (Signed)
Clonazepam last rx 04/06/20 #60 LOV: 04/06/20 Anxiety

## 2020-08-23 ENCOUNTER — Encounter: Payer: Self-pay | Admitting: Family Medicine

## 2020-08-23 NOTE — Telephone Encounter (Signed)
Please advise, Patient has not been seen here yet

## 2020-08-23 NOTE — Telephone Encounter (Signed)
Patient has TOC with PCP on April 18

## 2020-09-03 ENCOUNTER — Telehealth (INDEPENDENT_AMBULATORY_CARE_PROVIDER_SITE_OTHER): Payer: 59 | Admitting: Family Medicine

## 2020-09-03 ENCOUNTER — Encounter: Payer: 59 | Admitting: Family Medicine

## 2020-09-03 VITALS — Temp 101.6°F

## 2020-09-03 DIAGNOSIS — J45909 Unspecified asthma, uncomplicated: Secondary | ICD-10-CM | POA: Insufficient documentation

## 2020-09-03 DIAGNOSIS — K219 Gastro-esophageal reflux disease without esophagitis: Secondary | ICD-10-CM

## 2020-09-03 DIAGNOSIS — J4520 Mild intermittent asthma, uncomplicated: Secondary | ICD-10-CM

## 2020-09-03 DIAGNOSIS — K5090 Crohn's disease, unspecified, without complications: Secondary | ICD-10-CM | POA: Insufficient documentation

## 2020-09-03 DIAGNOSIS — K50919 Crohn's disease, unspecified, with unspecified complications: Secondary | ICD-10-CM

## 2020-09-03 DIAGNOSIS — F411 Generalized anxiety disorder: Secondary | ICD-10-CM

## 2020-09-03 MED ORDER — CLONAZEPAM 0.5 MG PO TABS: 0.5000 mg | ORAL_TABLET | Freq: Two times a day (BID) | ORAL | 5 refills | Status: DC | PRN

## 2020-09-03 MED ORDER — ONDANSETRON HCL 8 MG PO TABS: 8.0000 mg | ORAL_TABLET | Freq: Three times a day (TID) | ORAL | 0 refills | Status: DC | PRN

## 2020-09-03 NOTE — Assessment & Plan Note (Signed)
Stable.  Follows with GI.

## 2020-09-03 NOTE — Assessment & Plan Note (Signed)
Stable.  Continue Qvar 1 puff twice daily.  Does not need refill today.

## 2020-09-03 NOTE — Assessment & Plan Note (Signed)
Stable.  Database no red flags.  Refill clonazepam today.

## 2020-09-03 NOTE — Assessment & Plan Note (Signed)
Stable on Pepcid 20 mg twice daily.

## 2020-09-03 NOTE — Progress Notes (Signed)
Paul Arnold is a 40 y.o. male who presents today for a virtual office visit. He is transferring care to this office.   Assessment/Plan:  New/Acute Problems: COVID 19 No red flags.  Most concerning symptom at this time is his nausea/vomiting and diarrhea.  Will start Zofran.  Courage good oral hydration.  Is overall low risk and should do well.  Discussed reasons to return to care.  Follow-up as needed.  Chronic Problems Addressed Today: Crohn disease ([place]) Stable.  Follows with GI.  Asthma Stable.  Continue Qvar 1 puff twice daily.  Does not need refill today.  GERD (gastroesophageal reflux disease) Stable on Pepcid 20 mg twice daily.  GAD (generalized anxiety disorder) Stable.  Database no red flags.  Refill clonazepam today.     Subjective:  HPI:  Patient with covid symptoms for the last 4 days. Was in a medical conference in [place] and thinks he has acquired it from them. Symptoms including fever, chills, nausea, vomiting, diarrhea. Tried taking theraflu and OTC cough medication which has helped modestly.   See A/p for status of his other chronic conditions.   PMH:  The following were reviewed and entered/updated in epic: Past Medical History:  Diagnosis Date  . Appendicitis   . Asthma   . Crohn disease ([place])   . Periodontitis    Patient Active Problem List   Diagnosis Date Noted  . GERD (gastroesophageal reflux disease) 09/03/2020  . Asthma 09/03/2020  . Crohn disease ([place])   . GAD (generalized anxiety disorder) 01/06/2018   Past Surgical History:  Procedure Laterality Date  . APPENDECTOMY  04/23/2015   laproscopic   . LAPAROSCOPIC APPENDECTOMY N/A 04/23/2015   Procedure: APPENDECTOMY LAPAROSCOPIC;  Surgeon: Judeth Horn, MD;  Location: [facility] OR;  Service: General;  Laterality: N/A;    Family History  Problem Relation Age of Onset  . Healthy Mother   . Breast cancer Mother   . Ovarian cancer Mother   . Colon cancer Neg Hx   . Esophageal cancer  Neg Hx   . Rectal cancer Neg Hx   . Stomach cancer Neg Hx     Medications- reviewed and updated Current Outpatient Medications  Medication Sig Dispense Refill  . beclomethasone (QVAR) 80 MCG/ACT inhaler Inhale 1 puff into the lungs 2 (two) times daily. 10.6 g 3  . famotidine (PEPCID) 20 MG tablet Take 1 tablet (20 mg total) by mouth 2 (two) times daily. 60 tablet 3  . fluticasone (FLONASE) 50 MCG/ACT nasal spray Place 2 sprays into both nostrils daily as needed for rhinitis (or seasonal allergies).     . ondansetron (ZOFRAN) 8 MG tablet Take 1 tablet (8 mg total) by mouth every 8 (eight) hours as needed for nausea or vomiting. 20 tablet 0  . clonazePAM (KLONOPIN) 0.5 MG tablet Take 1 tablet (0.5 mg total) by mouth 2 (two) times daily as needed. 60 tablet 5   No current facility-administered medications for this visit.    Allergies-reviewed and updated Allergies  Allergen Reactions  . Contrast Media [Iodinated Diagnostic Agents] Hives and Itching    Itching , hives on 04-23-15 CT exam 05-10-15---pt got 1 hr emergent prep in ED for CT -dosed with Barium--no reaction noted  . Penicillins Other (See Comments)    From childhood- reaction not recalled Has patient had a PCN reaction causing immediate rash, facial/tongue/throat swelling, SOB or lightheadedness with hypotension: Unk Has patient had a PCN reaction causing severe rash involving mucus membranes or skin necrosis: Unk  Has patient had a PCN reaction that required hospitalization: Unk Has patient had a PCN reaction occurring within the last 10 years: No If all of the above answers are "NO", then may proceed with Cephalosporin use.     Social History   Socioeconomic History  . Marital status: Married    Spouse name: Not on file  . Number of children: 2  . Years of education: Not on file  . Highest education level: Not on file  Occupational History  . Occupation: Hemp Farmer  Tobacco Use  . Smoking status: Former Smoker     Packs/day: 0.33    Years: 14.00    Pack years: 4.62    Types: Cigarettes    Start date: 2006    Quit date: 2020    Years since quitting: 2.2  . Smokeless tobacco: Never Used  Vaping Use  . Vaping Use: Every day  . Substances: Nicotine  Substance and Sexual Activity  . Alcohol use: No  . Drug use: Yes    Types: Marijuana  . Sexual activity: Yes  Other Topics Concern  . Not on file  Social History Narrative  . Not on file   Social Determinants of Health   Financial Resource Strain: Not on file  Food Insecurity: Not on file  Transportation Needs: Not on file  Physical Activity: Not on file  Stress: Not on file  Social Connections: Not on file          Objective/Observations  Physical Exam: Gen: NAD, resting comfortably Pulm: Normal work of breathing Neuro: Grossly normal, moves all extremities Psych: Normal affect and thought content  Virtual Visit via Video   I connected with Paul Arnold on 09/03/20 at 11:00 AM EDT by a video enabled telemedicine application and verified that I am speaking with the correct person using two identifiers. The limitations of evaluation and management by telemedicine and the availability of in person appointments were discussed. The patient expressed understanding and agreed to proceed.   Patient location: Home Provider location: [place] Office Persons participating in the virtual visit: Myself and Patient  Time Spent: 45 minutes of total time was spent on the date of the encounter performing the following actions: chart review prior to seeing the patient including recent visits with previous PCP, obtaining history, performing a medically necessary exam, counseling on the treatment plan, placing orders, and documenting in our EHR.       Algis Greenhouse. Jerline Pain, MD 09/03/2020 12:28 PM

## 2020-09-26 ENCOUNTER — Encounter: Payer: 59 | Admitting: Family Medicine

## 2020-10-24 ENCOUNTER — Other Ambulatory Visit: Payer: Self-pay

## 2020-10-24 ENCOUNTER — Ambulatory Visit (INDEPENDENT_AMBULATORY_CARE_PROVIDER_SITE_OTHER): Payer: 59 | Admitting: Family Medicine

## 2020-10-24 ENCOUNTER — Encounter: Payer: Self-pay | Admitting: Family Medicine

## 2020-10-24 VITALS — BP 107/72 | HR 72 | Temp 98.4°F | Ht 66.0 in | Wt 120.4 lb

## 2020-10-24 DIAGNOSIS — K50919 Crohn's disease, unspecified, with unspecified complications: Secondary | ICD-10-CM | POA: Diagnosis not present

## 2020-10-24 DIAGNOSIS — F411 Generalized anxiety disorder: Secondary | ICD-10-CM | POA: Diagnosis not present

## 2020-10-24 DIAGNOSIS — J029 Acute pharyngitis, unspecified: Secondary | ICD-10-CM | POA: Diagnosis not present

## 2020-10-24 DIAGNOSIS — K219 Gastro-esophageal reflux disease without esophagitis: Secondary | ICD-10-CM

## 2020-10-24 DIAGNOSIS — J4520 Mild intermittent asthma, uncomplicated: Secondary | ICD-10-CM | POA: Diagnosis not present

## 2020-10-24 DIAGNOSIS — Z0001 Encounter for general adult medical examination with abnormal findings: Secondary | ICD-10-CM

## 2020-10-24 LAB — POCT RAPID STREP A (OFFICE): Rapid Strep A Screen: NEGATIVE

## 2020-10-24 NOTE — Progress Notes (Signed)
Chief Complaint:  Paul Arnold is a 40 y.o. male who presents today for his annual comprehensive physical exam.    Assessment/Plan:  New/Acute Problems: Sore Throat Strep negative. Possibly mild viral URI or allergies. Will continue with conservative management.   Chronic Problems Addressed Today: Crohn disease ([place]) Stable.  Follow-up with GI.  Having issues with trying to maintain his weight.  Will place referral to nutrition.  Asthma Doing well on Qvar daily.  GERD (gastroesophageal reflux disease) On Pepcid 20 mg twice daily.  Doing well with this.  GAD (generalized anxiety disorder) On clonazepam 0.68m twice daily as needed.  Does well with this.  Minimal side effects.   Preventative Healthcare: Up-to-date on vaccines.  Patient Counseling(The following topics were reviewed and/or handout was given):  -Nutrition: Stressed importance of moderation in sodium/caffeine intake, saturated fat and cholesterol, caloric balance, sufficient intake of fresh fruits, vegetables, and fiber.  -Stressed the importance of regular exercise.   -Substance Abuse: Discussed cessation/primary prevention of tobacco, alcohol, or other drug use; driving or other dangerous activities under the influence; availability of treatment for abuse.   -Injury prevention: Discussed safety belts, safety helmets, smoke detector, smoking near bedding or upholstery.   -Sexuality: Discussed sexually transmitted diseases, partner selection, use of condoms, avoidance of unintended pregnancy and contraceptive alternatives.   -Dental health: Discussed importance of regular tooth brushing, flossing, and dental visits.  -Health maintenance and immunizations reviewed. Please refer to Health maintenance section.  Return to care in 1 year for next preventative visit.     Subjective:  HPI:  Has a sore Throat since this morning.  No fevers or chills. No treatments tried. 3 sick contacts with strep.    Lifestyle Diet: Balanced.  Exercise: Plenty of exercise.   Depression screen PHQ 2/9 10/24/2020  Decreased Interest 0  Down, Depressed, Hopeless 0  PHQ - 2 Score 0  Altered sleeping -  Tired, decreased energy -  Change in appetite -  Feeling bad or failure about yourself  -  Trouble concentrating -  Moving slowly or fidgety/restless -  Suicidal thoughts -  PHQ-9 Score -  Difficult doing work/chores -   Health Maintenance Due  Topic Date Due  . HIV Screening  Never done  . Hepatitis C Screening  Never done    ROS: Per HPI, otherwise a complete review of systems was negative.   PMH:  The following were reviewed and entered/updated in epic: Past Medical History:  Diagnosis Date  . Appendicitis   . Asthma   . [place] disease [place]   . Periodontitis    Patient Active Problem List   Diagnosis Date Noted  . GERD (gastroesophageal reflux disease) 09/03/2020  . Asthma 09/03/2020  . Crohn disease ([place]   . GAD (generalized anxiety disorder) 01/06/2018   Past Surgical History:  Procedure Laterality Date  . APPENDECTOMY  04/23/2015   laproscopic   . LAPAROSCOPIC APPENDECTOMY N/A 04/23/2015   Procedure: APPENDECTOMY LAPAROSCOPIC;  Surgeon: JJudeth Horn MD;  [facility];  Service: General;  Laterality: N/A;    Family History  Problem Relation Age of Onset  . Healthy Mother   . Breast cancer Mother   . Ovarian cancer Mother   . Colon cancer Neg Hx   . Esophageal cancer Neg Hx   . Rectal cancer Neg Hx   . Stomach cancer Neg Hx     Medications- reviewed and updated Current Outpatient Medications  Medication Sig Dispense Refill  . beclomethasone (QVAR) 80 MCG/ACT  inhaler Inhale 1 puff into the lungs 2 (two) times daily. 10.6 g 3  . clonazePAM (KLONOPIN) 0.5 MG tablet Take 1 tablet (0.5 mg total) by mouth 2 (two) times daily as needed. 60 tablet 5  . famotidine (PEPCID) 20 MG tablet Take 1 tablet (20 mg total) by mouth 2 (two) times daily. 60 tablet 3  . fluticasone  (FLONASE) 50 MCG/ACT nasal spray Place 2 sprays into both nostrils daily as needed for rhinitis (or seasonal allergies).      No current facility-administered medications for this visit.    Allergies-reviewed and updated Allergies  Allergen Reactions  . Contrast Media [Iodinated Diagnostic Agents] Hives and Itching    Itching , hives on 04-23-15 CT exam 05-10-15---pt got 1 hr emergent prep in ED for CT -dosed with Barium--no reaction noted  . Penicillins Other (See Comments)    From childhood- reaction not recalled Has patient had a PCN reaction causing immediate rash, facial/tongue/throat swelling, SOB or lightheadedness with hypotension: Unk Has patient had a PCN reaction causing severe rash involving mucus membranes or skin necrosis: Unk Has patient had a PCN reaction that required hospitalization: Unk Has patient had a PCN reaction occurring within the last 10 years: No If all of the above answers are "NO", then may proceed with Cephalosporin use.     Social History   Socioeconomic History  . Marital status: Married    Spouse name: Not on file  . Number of children: 2  . Years of education: Not on file  . Highest education level: Not on file  Occupational History  . Occupation: Hemp Farmer  Tobacco Use  . Smoking status: Former Smoker    Packs/day: 0.33    Years: 14.00    Pack years: 4.62    Types: Cigarettes    Start date: 2006    Quit date: 2020    Years since quitting: 2.4  . Smokeless tobacco: Never Used  Vaping Use  . Vaping Use: Every day  . Substances: Nicotine  Substance and Sexual Activity  . Alcohol use: No  . Drug use: Yes    Types: Marijuana  . Sexual activity: Yes  Other Topics Concern  . Not on file  Social History Narrative  . Not on file   Social Determinants of Health   Financial Resource Strain: Not on file  Food Insecurity: Not on file  Transportation Needs: Not on file  Physical Activity: Not on file  Stress: Not on file  Social  Connections: Not on file        Objective:  Physical Exam: BP 107/72   Pulse 72   Temp 98.4 F (36.9 C) (Temporal)   Ht 5' 6"  (1.676 m)   Wt 120 lb 6.4 oz (54.6 kg)   SpO2 99%   BMI 19.43 kg/m   Body mass index is 19.43 kg/m. Wt Readings from Last 3 Encounters:  10/24/20 120 lb 6.4 oz (54.6 kg)  10/06/19 110 lb (49.9 kg)  09/29/19 113 lb (51.3 kg)   Gen: NAD, resting comfortably HEENT: TMs normal bilaterally. OP clear. No thyromegaly noted.  CV: RRR with no murmurs appreciated Pulm: NWOB, CTAB with no crackles, wheezes, or rhonchi GI: Normal bowel sounds present. Soft, Nontender, Nondistended. MSK: no edema, cyanosis, or clubbing noted Skin: warm, dry Neuro: CN2-12 grossly intact. Strength 5/5 in upper and lower extremities. Reflexes symmetric and intact bilaterally.  Psych: Normal affect and thought content     Ossiel Marchio M. Jerline Pain, MD 10/24/2020 11:30 AM

## 2020-10-24 NOTE — Assessment & Plan Note (Signed)
On Pepcid 20 mg twice daily.  Doing well with this.

## 2020-10-24 NOTE — Assessment & Plan Note (Signed)
On clonazepam 0.62m twice daily as needed.  Does well with this.  Minimal side effects.

## 2020-10-24 NOTE — Assessment & Plan Note (Signed)
Doing well on Qvar daily.

## 2020-10-24 NOTE — Assessment & Plan Note (Signed)
Stable.  Follow-up with GI.  Having issues with trying to maintain his weight.  Will place referral to nutrition.

## 2020-12-04 ENCOUNTER — Telehealth: Payer: 59 | Admitting: Emergency Medicine

## 2020-12-04 ENCOUNTER — Telehealth: Payer: 59 | Admitting: Physician Assistant

## 2020-12-04 DIAGNOSIS — U071 COVID-19: Secondary | ICD-10-CM

## 2020-12-04 MED ORDER — MOLNUPIRAVIR EUA 200MG CAPSULE: 4.0000 | ORAL_CAPSULE | Freq: Two times a day (BID) | ORAL | 0 refills | Status: AC

## 2020-12-04 MED ORDER — ALBUTEROL SULFATE (2.5 MG/3ML) 0.083% IN NEBU: 2.5000 mg | INHALATION_SOLUTION | Freq: Four times a day (QID) | RESPIRATORY_TRACT | 1 refills | Status: DC | PRN

## 2020-12-04 MED ORDER — BENZONATATE 100 MG PO CAPS
100.0000 mg | ORAL_CAPSULE | Freq: Three times a day (TID) | ORAL | 0 refills | Status: DC | PRN
Start: 2020-12-04 — End: 2022-09-23

## 2020-12-04 NOTE — Patient Instructions (Signed)
  Paul Arnold, thank you for joining [name], PA-C for today's virtual visit.  While this provider is not your primary care provider (PCP), if your PCP is located in our provider database this encounter information will be shared with them immediately following your visit.  Consent: (Patient) Paul Arnold provided verbal consent for this virtual visit at the beginning of the encounter.  Current Medications:  Current Outpatient Medications:    beclomethasone (QVAR) 80 MCG/ACT inhaler, Inhale 1 puff into the lungs 2 (two) times daily., Disp: 10.6 g, Rfl: 3   clonazePAM (KLONOPIN) 0.5 MG tablet, Take 1 tablet (0.5 mg total) by mouth 2 (two) times daily as needed., Disp: 60 tablet, Rfl: 5   famotidine (PEPCID) 20 MG tablet, Take 1 tablet (20 mg total) by mouth 2 (two) times daily., Disp: 60 tablet, Rfl: 3   fluticasone (FLONASE) 50 MCG/ACT nasal spray, Place 2 sprays into both nostrils daily as needed for rhinitis (or seasonal allergies). , Disp: , Rfl:    Medications ordered in this encounter:  No orders of the defined types were placed in this encounter.    *If you need refills on other medications prior to your next appointment, please contact your pharmacy*  Follow-Up: Call back or seek an in-person evaluation if the symptoms worsen or if the condition fails to improve as anticipated.  Other Instructions Please keep well-hydrated and get plenty of rest. Start a saline nasal rinse to flush out your nasal passages. You can use plain Mucinex to help thin congestion. If you have a humidifier, running in the bedroom at night. I want you to start OTC vitamin D3 1000 units daily, vitamin C 1000 mg daily, and a zinc supplement. Please take prescribed medications as directed.  You have been enrolled in a MyChart symptom monitoring program. Please answer these questions daily so we can keep track of how you are doing.  You were to quarantine for 5 days from onset of your  symptoms.  After day 5, if you have had no fever and you are feeling better, you can end quarantine but need to mask for an additional 5 days. After day 5 if you have a fever or are having significant symptoms, please quarantine for full 10 days.  If you note any worsening of symptoms, any significant shortness of breath or any chest pain, please seek ER evaluation ASAP.  Please do not delay care!    If you have been instructed to have an in-person evaluation today at a local Urgent Care facility, please use the link below. It will take you to a list of all of our available [facility], including address, phone number and hours of operation. Please do not delay care.  [facility]  If you or a family member do not have a primary care provider, use the link below to schedule a visit and establish care. When you choose a [facility] primary care physician or advanced practice provider, you gain a long-term partner in health. Find a Primary Care Provider    Learn more about [facility]'s in-office and virtual care options: [facility]

## 2020-12-04 NOTE — Progress Notes (Signed)
We can't prescribe the new COVID antiviral treatments through e-visits, but they are available through video visits.  See below.  Based on the information that you have shared in the e-Visit Questionnaire, we recommend that you convert this visit to a video visit in order for the provider to better assess what is going on.     The provider will be able to give you a more accurate diagnosis and treatment plan if we can more freely discuss your symptoms and with the addition of a virtual examination.   If you convert to a video visit, we will bill your insurance (similar to an office visit) and you will not be charged for this e-Visit. You will be able to stay at home and speak with the first available [facility] advanced practice provider. The link to do a video visit is in the drop down Menu tab of your Welcome screen in [place].  Approximately 5 minutes was used in reviewing the patient's chart, questionnaire, prescribing medications, and documentation.

## 2020-12-04 NOTE — Progress Notes (Signed)
Virtual Visit Consent   Paul Arnold, you are scheduled for a virtual visit with a [facility] provider today.     Just as with appointments in the office, your consent must be obtained to participate.  Your consent will be active for this visit and any virtual visit you may have with one of our providers in the next 365 days.     If you have a MyChart account, a copy of this consent can be sent to you electronically.  All virtual visits are billed to your insurance company just like a traditional visit in the office.    As this is a virtual visit, video technology does not allow for your provider to perform a traditional examination.  This may limit your provider's ability to fully assess your condition.  If your provider identifies any concerns that need to be evaluated in person or the need to arrange testing (such as labs, EKG, etc.), we will make arrangements to do so.     Although advances in technology are sophisticated, we cannot ensure that it will always work on either your end or our end.  If the connection with a video visit is poor, the visit may have to be switched to a telephone visit.  With either a video or telephone visit, we are not always able to ensure that we have a secure connection.     I need to obtain your verbal consent now.   Are you willing to proceed with your visit today?    Paul Arnold has provided verbal consent on 12/04/2020 for a virtual visit (video or telephone).   Paul Arnold, [place]   Date: 12/04/2020 11:15 AM   Virtual Visit via Video Note   I, Paul Arnold, connected with  Paul Arnold  ([id number], [birth year]) on 12/04/20 at 11:00 AM EDT by a video-enabled telemedicine application and verified that I am speaking with the correct person using two identifiers.  Location: Patient: Virtual Visit Location Patient: Home Provider: Virtual Visit Location Provider: Home Office   I discussed the limitations of evaluation and  management by telemedicine and the availability of in person appointments. The patient expressed understanding and agreed to proceed.    History of Present Illness: Paul Arnold is a 40 y.o. who identifies as a male who was assigned male at birth, and is being seen today for COVID-19. Patient endorses symptoms starting Sunday and including severe body aches, headaches and fever with Tmax 103. Has taken Aleve to help with this with improvement in fever < 102. Notes chest tightness and chest wall soreness. Has had multiple + COVID tests since symptom onset. Patient with history of asthma. Has used his nebulizer once with some relief. Would have used more frequently but out of solution.    HPI: HPI  Problems:  Patient Active Problem List   Diagnosis Date Noted   GERD (gastroesophageal reflux disease) 09/03/2020   Asthma 09/03/2020   Crohn disease ([place])    GAD (generalized anxiety disorder) 01/06/2018    Allergies:  Allergies  Allergen Reactions   Contrast Media [Iodinated Diagnostic Agents] Hives and Itching    Itching , hives on 04-23-15 CT exam 05-10-15---pt got 1 hr emergent prep in ED for CT -dosed with Barium--no reaction noted   Penicillins Other (See Comments)    From childhood- reaction not recalled Has patient had a PCN reaction causing immediate rash, facial/tongue/throat swelling, SOB or lightheadedness with hypotension: Unk Has patient had a PCN  reaction causing severe rash involving mucus membranes or skin necrosis: Unk Has patient had a PCN reaction that required hospitalization: Unk Has patient had a PCN reaction occurring within the last 10 years: No If all of the above answers are "NO", then may proceed with Cephalosporin use.    Medications:  Current Outpatient Medications:    albuterol (PROVENTIL) (2.5 MG/3ML) 0.083% nebulizer solution, Take 3 mLs (2.5 mg total) by nebulization every 6 (six) hours as needed for wheezing or shortness of breath., Disp: 150 mL, Rfl:  1   benzonatate (TESSALON) 100 MG capsule, Take 1 capsule (100 mg total) by mouth 3 (three) times daily as needed for cough., Disp: 30 capsule, Rfl: 0   molnupiravir EUA 200 mg CAPS, Take 4 capsules (800 mg total) by mouth 2 (two) times daily for 5 days., Disp: 40 capsule, Rfl: 0   beclomethasone (QVAR) 80 MCG/ACT inhaler, Inhale 1 puff into the lungs 2 (two) times daily., Disp: 10.6 g, Rfl: 3   clonazePAM (KLONOPIN) 0.5 MG tablet, Take 1 tablet (0.5 mg total) by mouth 2 (two) times daily as needed., Disp: 60 tablet, Rfl: 5   famotidine (PEPCID) 20 MG tablet, Take 1 tablet (20 mg total) by mouth 2 (two) times daily., Disp: 60 tablet, Rfl: 3   fluticasone (FLONASE) 50 MCG/ACT nasal spray, Place 2 sprays into both nostrils daily as needed for rhinitis (or seasonal allergies). , Disp: , Rfl:   Observations/Objective: Patient is well-developed, well-nourished in no acute distress.  Resting comfortably on bed at home.  Head is normocephalic, atraumatic.  No labored breathing. Speech is clear and coherent with logical content.  Patient is alert and oriented at baseline.   Assessment and Plan: 1. COVID-19 - benzonatate (TESSALON) 100 MG capsule; Take 1 capsule (100 mg total) by mouth 3 (three) times daily as needed for cough.  Dispense: 30 capsule; Refill: 0 - molnupiravir EUA 200 mg CAPS; Take 4 capsules (800 mg total) by mouth 2 (two) times daily for 5 days.  Dispense: 40 capsule; Refill: 0 - albuterol (PROVENTIL) (2.5 MG/3ML) 0.083% nebulizer solution; Take 3 mLs (2.5 mg total) by nebulization every 6 (six) hours as needed for wheezing or shortness of breath.  Dispense: 150 mL; Refill: 1 - MyChart COVID-19 home monitoring program; Future In patient with asthma. Moderate symptoms and high fever (controlled with OTC medications). Recommend antivirals. Discussed pros/cons and potential ADRs of medicine. He is willing to proceed. Rx Molnupiravir sent to pharmacy. Supportive measures, OTC medications  and Vitamin regimen reviewed. Quarantine reviewed. Strict ER precautions discussed with patient who voiced understanding and agreement with the plan. He has been enrolled in a symptom monitoring program.   Follow Up Instructions: I discussed the assessment and treatment plan with the patient. The patient was provided an opportunity to ask questions and all were answered. The patient agreed with the plan and demonstrated an understanding of the instructions.  A copy of instructions were sent to the patient via MyChart.  The patient was advised to call back or seek an in-person evaluation if the symptoms worsen or if the condition fails to improve as anticipated.  Time:  I spent 17 minutes with the patient via telehealth technology discussing the above problems/concerns.    Paul Rio, PA-C

## 2021-04-15 ENCOUNTER — Telehealth: Payer: 59 | Admitting: Emergency Medicine

## 2021-04-15 DIAGNOSIS — R221 Localized swelling, mass and lump, neck: Secondary | ICD-10-CM

## 2021-04-15 NOTE — Progress Notes (Signed)
Virtual Visit Consent   Paul Arnold, you are scheduled for a virtual visit with a [facility] provider today.     Just as with appointments in the office, your consent must be obtained to participate.  Your consent will be active for this visit and any virtual visit you may have with one of our providers in the next 365 days.     If you have a MyChart account, a copy of this consent can be sent to you electronically.  All virtual visits are billed to your insurance company just like a traditional visit in the office.    As this is a virtual visit, video technology does not allow for your provider to perform a traditional examination.  This may limit your provider's ability to fully assess your condition.  If your provider identifies any concerns that need to be evaluated in person or the need to arrange testing (such as labs, EKG, etc.), we will make arrangements to do so.     Although advances in technology are sophisticated, we cannot ensure that it will always work on either your end or our end.  If the connection with a video visit is poor, the visit may have to be switched to a telephone visit.  With either a video or telephone visit, we are not always able to ensure that we have a secure connection.     I need to obtain your verbal consent now.   Are you willing to proceed with your visit today?    Paul Arnold has provided verbal consent on 04/15/2021 for a virtual visit video.   Montine Circle, PA-C   Date: 04/15/2021 10:58 AM   Virtual Visit via Video Note   I, Montine Circle, connected with  Paul Arnold  ([id number], Paul Arnold, 40) on 04/15/21 at 11:00 AM EST by a video-enabled telemedicine application and verified that I am speaking with the correct person using two identifiers.  Location: Patient: Virtual Visit Location Patient: Home Provider: Virtual Visit Location Provider: Home Office   I discussed the limitations of evaluation and management by  telemedicine and the availability of in person appointments. The patient expressed understanding and agreed to proceed.    History of Present Illness: Paul Arnold is a 40 y.o. who identifies as a male who was assigned male at birth, and is being seen today for chief complaint of lump on neck.  States that it comes and goes for about 3 months.  States that it has been present for 1 week.  Denies any fever or chills.  Has tried putting menthol on it.  HPI: HPI  Problems:  Patient Active Problem List   Diagnosis Date Noted   GERD (gastroesophageal reflux disease) 09/03/2020   Asthma 09/03/2020   Crohn disease ([place])    GAD (generalized anxiety disorder) 01/06/2018    Allergies:  Allergies  Allergen Reactions   Contrast Media [Iodinated Diagnostic Agents] Hives and Itching    Itching , hives on 04-23-15 CT exam 05-10-15---pt got 1 hr emergent prep in ED for CT -dosed with Barium--no reaction noted   Penicillins Other (See Comments)    From childhood- reaction not recalled Has patient had a PCN reaction causing immediate rash, facial/tongue/throat swelling, SOB or lightheadedness with hypotension: Unk Has patient had a PCN reaction causing severe rash involving mucus membranes or skin necrosis: Unk Has patient had a PCN reaction that required hospitalization: Unk Has patient had a PCN reaction occurring within the last 10 years: No  If all of the above answers are "NO", then may proceed with Cephalosporin use.    Medications:  Current Outpatient Medications:    albuterol (PROVENTIL) (2.5 MG/3ML) 0.083% nebulizer solution, Take 3 mLs (2.5 mg total) by nebulization every 6 (six) hours as needed for wheezing or shortness of breath., Disp: 150 mL, Rfl: 1   beclomethasone (QVAR) 80 MCG/ACT inhaler, Inhale 1 puff into the lungs 2 (two) times daily., Disp: 10.6 g, Rfl: 3   benzonatate (TESSALON) 100 MG capsule, Take 1 capsule (100 mg total) by mouth 3 (three) times daily as needed for cough.,  Disp: 30 capsule, Rfl: 0   clonazePAM (KLONOPIN) 0.5 MG tablet, Take 1 tablet (0.5 mg total) by mouth 2 (two) times daily as needed., Disp: 60 tablet, Rfl: 5   famotidine (PEPCID) 20 MG tablet, Take 1 tablet (20 mg total) by mouth 2 (two) times daily., Disp: 60 tablet, Rfl: 3   fluticasone (FLONASE) 50 MCG/ACT nasal spray, Place 2 sprays into both nostrils daily as needed for rhinitis (or seasonal allergies). , Disp: , Rfl:   Observations/Objective: Patient is well-developed, well-nourished in no acute distress.  Resting comfortably  at home.  Head is normocephalic, atraumatic.  No labored breathing.  Speech is clear and coherent with logical content.  Patient is alert and oriented at baseline.  Normal phonation, no erythema seen on video  Assessment and Plan: 1. Neck mass Probable swollen lymph node, could be salivary gland stone or dental abscess.  Recommend warm compresses and PCP follow-up if not improving.  Follow Up Instructions: I discussed the assessment and treatment plan with the patient. The patient was provided an opportunity to ask questions and all were answered. The patient agreed with the plan and demonstrated an understanding of the instructions.  A copy of instructions were sent to the patient via MyChart unless otherwise noted below.     The patient was advised to call back or seek an in-person evaluation if the symptoms worsen or if the condition fails to improve as anticipated.  Time:  I spent 6 minutes with the patient via telehealth technology discussing the above problems/concerns.    Montine Circle, PA-C

## 2021-05-29 ENCOUNTER — Other Ambulatory Visit: Payer: Self-pay | Admitting: Family Medicine

## 2021-05-29 DIAGNOSIS — F411 Generalized anxiety disorder: Secondary | ICD-10-CM

## 2021-08-13 ENCOUNTER — Other Ambulatory Visit: Payer: Self-pay | Admitting: Family Medicine

## 2021-08-13 DIAGNOSIS — F411 Generalized anxiety disorder: Secondary | ICD-10-CM

## 2021-08-13 MED ORDER — CLONAZEPAM 0.5 MG PO TABS: 0.5000 mg | ORAL_TABLET | Freq: Two times a day (BID) | ORAL | 0 refills | Status: DC

## 2021-09-10 ENCOUNTER — Ambulatory Visit: Payer: 59 | Admitting: Family Medicine

## 2021-09-13 ENCOUNTER — Ambulatory Visit: Payer: 59 | Admitting: Family Medicine

## 2022-01-29 ENCOUNTER — Other Ambulatory Visit: Payer: Self-pay | Admitting: Family Medicine

## 2022-01-29 DIAGNOSIS — F411 Generalized anxiety disorder: Secondary | ICD-10-CM

## 2022-02-10 ENCOUNTER — Encounter: Payer: Self-pay | Admitting: *Deleted

## 2022-05-01 ENCOUNTER — Encounter: Payer: Self-pay | Admitting: *Deleted

## 2022-09-23 ENCOUNTER — Encounter: Payer: Self-pay | Admitting: Family Medicine

## 2022-09-23 ENCOUNTER — Ambulatory Visit (INDEPENDENT_AMBULATORY_CARE_PROVIDER_SITE_OTHER): Payer: 59 | Admitting: Family Medicine

## 2022-09-23 VITALS — BP 111/67 | HR 89 | Temp 98.0°F | Ht 66.0 in | Wt 124.2 lb

## 2022-09-23 DIAGNOSIS — Z13220 Encounter for screening for lipoid disorders: Secondary | ICD-10-CM

## 2022-09-23 DIAGNOSIS — Z131 Encounter for screening for diabetes mellitus: Secondary | ICD-10-CM

## 2022-09-23 DIAGNOSIS — F411 Generalized anxiety disorder: Secondary | ICD-10-CM | POA: Diagnosis not present

## 2022-09-23 DIAGNOSIS — Z0001 Encounter for general adult medical examination with abnormal findings: Secondary | ICD-10-CM | POA: Diagnosis not present

## 2022-09-23 DIAGNOSIS — J4520 Mild intermittent asthma, uncomplicated: Secondary | ICD-10-CM

## 2022-09-23 DIAGNOSIS — K50919 Crohn's disease, unspecified, with unspecified complications: Secondary | ICD-10-CM | POA: Diagnosis not present

## 2022-09-23 DIAGNOSIS — U071 COVID-19: Secondary | ICD-10-CM

## 2022-09-23 LAB — COMPREHENSIVE METABOLIC PANEL
ALT: 25 U/L (ref 0–53)
AST: 19 U/L (ref 0–37)
Albumin: 4.4 g/dL (ref 3.5–5.2)
Alkaline Phosphatase: 94 U/L (ref 39–117)
BUN: 9 mg/dL (ref 6–23)
CO2: 33 mEq/L — ABNORMAL HIGH (ref 19–32)
Calcium: 9.3 mg/dL (ref 8.4–10.5)
Chloride: 101 mEq/L (ref 96–112)
Creatinine, Ser: 0.95 mg/dL (ref 0.40–1.50)
GFR: 99.36 mL/min (ref >60.00)
Glucose, Bld: 84 mg/dL (ref 70–99)
Potassium: 3.9 mEq/L (ref 3.5–5.1)
Sodium: 140 mEq/L (ref 135–145)
Total Bilirubin: 0.4 mg/dL (ref 0.2–1.2)
Total Protein: 6.9 g/dL (ref 6.0–8.3)

## 2022-09-23 LAB — LIPID PANEL
Cholesterol: 162 mg/dL (ref 0–200)
HDL: 38.1 mg/dL — ABNORMAL LOW (ref >39.00)
LDL Cholesterol: 99 mg/dL (ref 0–99)
NonHDL: 123.51
Total CHOL/HDL Ratio: 4
Triglycerides: 124 mg/dL (ref 0.0–149.0)
VLDL: 24.8 mg/dL (ref 0.0–40.0)

## 2022-09-23 LAB — CBC
HCT: 42.3 % (ref 39.0–52.0)
Hemoglobin: 14.6 g/dL (ref 13.0–17.0)
MCHC: 34.5 g/dL (ref 30.0–36.0)
MCV: 98.5 fl (ref 78.0–100.0)
Platelets: 205 10*3/uL (ref 150.0–400.0)
RBC: 4.3 Mil/uL (ref 4.22–5.81)
RDW: 12.7 % (ref 11.5–15.5)
WBC: 6 10*3/uL (ref 4.0–10.5)

## 2022-09-23 LAB — HEMOGLOBIN A1C: Hgb A1c MFr Bld: 5.5 % (ref 4.6–6.5)

## 2022-09-23 LAB — TSH: TSH: 2.04 u[IU]/mL (ref 0.35–5.50)

## 2022-09-23 MED ORDER — ALBUTEROL SULFATE (2.5 MG/3ML) 0.083% IN NEBU
2.5000 mg | INHALATION_SOLUTION | Freq: Four times a day (QID) | RESPIRATORY_TRACT | 1 refills | Status: AC | PRN
Start: 2022-09-23 — End: ?

## 2022-09-23 MED ORDER — BECLOMETHASONE DIPROP HFA 80 MCG/ACT IN AERB: 1.0000 | INHALATION_SPRAY | Freq: Two times a day (BID) | RESPIRATORY_TRACT | 3 refills | Status: AC

## 2022-09-23 NOTE — Patient Instructions (Addendum)
It was very nice to see you today!  I am glad that you are doing well!  Will check blood work today.  Please continue to work on diet and exercise  Return in about 1 year (around 09/23/2023) for Annual Physical.   Take care, Dr Jimmey Ralph  PLEASE NOTE:  If you had any lab tests, please let us know if you have not heard back within a few days. You may see your results on mychart before we have a chance to review them but we will give you a call once they are reviewed by [place].   If we ordered any referrals today, please let us know if you have not heard from their office within the next week.   If you had any urgent prescriptions sent in today, please check with the pharmacy within an hour of our visit to make sure the prescription was transmitted appropriately.   Please try these tips to maintain a healthy lifestyle:  Eat at least 3 REAL meals and 1-2 snacks per day.  Aim for no more than 5 hours between eating.  If you eat breakfast, please do so within one hour of getting up.   Each meal should contain half fruits/vegetables, one quarter protein, and one quarter carbs (no bigger than a computer mouse)  Cut down on sweet beverages. This includes juice, soda, and sweet tea.   Drink at least 1 glass of water with each meal and aim for at least 8 glasses per day  Exercise at least 150 minutes every week.    Preventive Care 40-82 Years Old, Male Preventive care refers to lifestyle choices and visits with your health care provider that can promote health and wellness. Preventive care visits are also called wellness exams. What can I expect for my preventive care visit? Counseling During your preventive care visit, your health care provider may ask about your: Medical history, including: Past medical problems. Family medical history. Current health, including: Emotional well-being. Home life and relationship well-being. Sexual activity. Lifestyle, including: Alcohol, nicotine or  tobacco, and drug use. Access to firearms. Diet, exercise, and sleep habits. Safety issues such as seatbelt and bike helmet use. Sunscreen use. Work and work Astronomer. Physical exam Your health care provider will check your: Height and weight. These may be used to calculate your BMI (body mass index). BMI is a measurement that tells if you are at a healthy weight. Waist circumference. This measures the distance around your waistline. This measurement also tells if you are at a healthy weight and may help predict your risk of certain diseases, such as type 2 diabetes and high blood pressure. Heart rate and blood pressure. Body temperature. Skin for abnormal spots. What immunizations do I need?  Vaccines are usually given at various ages, according to a schedule. Your health care provider will recommend vaccines for you based on your age, medical history, and lifestyle or other factors, such as travel or where you work. What tests do I need? Screening Your health care provider may recommend screening tests for certain conditions. This may include: Lipid and cholesterol levels. Diabetes screening. This is done by checking your blood sugar (glucose) after you have not eaten for a while (fasting). Hepatitis B test. Hepatitis C test. HIV (human immunodeficiency virus) test. STI (sexually transmitted infection) testing, if you are at risk. Lung cancer screening. Prostate cancer screening. Colorectal cancer screening. Talk with your health care provider about your test results, treatment options, and if necessary, the need for more  tests. Follow these instructions at home: Eating and drinking  Eat a diet that includes fresh fruits and vegetables, whole grains, lean protein, and low-fat dairy products. Take vitamin and mineral supplements as recommended by your health care provider. Do not drink alcohol if your health care provider tells you not to drink. If you drink alcohol: Limit how  much you have to 0-2 drinks a day. Know how much alcohol is in your drink. In the [place] one drink equals one 12 oz bottle of beer (355 mL), one 5 oz glass of wine (148 mL), or one 1 oz glass of hard liquor (44 mL). Lifestyle Brush your teeth every morning and night with fluoride toothpaste. Floss one time each day. Exercise for at least 30 minutes 5 or more days each week. Do not use any products that contain nicotine or tobacco. These products include cigarettes, chewing tobacco, and vaping devices, such as e-cigarettes. If you need help quitting, ask your health care provider. Do not use drugs. If you are sexually active, practice safe sex. Use a condom or other form of protection to prevent STIs. Take aspirin only as told by your health care provider. Make sure that you understand how much to take and what form to take. Work with your health care provider to find out whether it is safe and beneficial for you to take aspirin daily. Find healthy ways to manage stress, such as: Meditation, yoga, or listening to music. Journaling. Talking to a trusted person. Spending time with friends and family. Minimize exposure to UV radiation to reduce your risk of skin cancer. Safety Always wear your seat belt while driving or riding in a vehicle. Do not drive: If you have been drinking alcohol. Do not ride with someone who has been drinking. When you are tired or distracted. While texting. If you have been using any mind-altering substances or drugs. Wear a helmet and other protective equipment during sports activities. If you have firearms in your house, make sure you follow all gun safety procedures. What's next? Go to your health care provider once a year for an annual wellness visit. Ask your health care provider how often you should have your eyes and teeth checked. Stay up to date on all vaccines. This information is not intended to replace advice given to you by your health care provider.  Make sure you discuss any questions you have with your health care provider. Document Revised: 10/31/2020 Document Reviewed: 10/31/2020 Elsevier Patient Education  [employer].

## 2022-09-23 NOTE — Assessment & Plan Note (Signed)
Stable follows with GI.  No recent flares.

## 2022-09-23 NOTE — Progress Notes (Signed)
Chief Complaint:  Paul Arnold is a 42 y.o. male who presents today for his annual comprehensive physical exam.    Assessment/Plan:  Chronic Problems Addressed Today: GAD (generalized anxiety disorder) Stable on clonazepam 0.5 mg twice daily as needed.  Does well with this.  No significant side effects.  Uses once or twice monthly if needed.Does not need refill today.    Crohn disease ([facility]) Stable follows with GI.  No recent flares.  Asthma Stable on albuterol and qvar as needed seasonally.  Will refill today.  Reassuring exam today.  Preventative Healthcare: Check labs.  Patient Counseling(The following topics were reviewed and/or handout was given):  -Nutrition: Stressed importance of moderation in sodium/caffeine intake, saturated fat and cholesterol, caloric balance, sufficient intake of fresh fruits, vegetables, and fiber.  -Stressed the importance of regular exercise.   -Substance Abuse: Discussed cessation/primary prevention of tobacco, alcohol, or other drug use; driving or other dangerous activities under the influence; availability of treatment for abuse.   -Injury prevention: Discussed safety belts, safety helmets, smoke detector, smoking near bedding or upholstery.   -Sexuality: Discussed sexually transmitted diseases, partner selection, use of condoms, avoidance of unintended pregnancy and contraceptive alternatives.   -Dental health: Discussed importance of regular tooth brushing, flossing, and dental visits.  -Health maintenance and immunizations reviewed. Please refer to Health maintenance section.  Return to care in 1 year for next preventative visit.     Subjective:  HPI:  He has no acute complaints today.   Lifestyle Diet: Cutting down sugar.  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      09/23/2022    1:19 PM  Depression screen PHQ 2/9  Decreased Interest 0  Down, Depressed, Hopeless 0  PHQ - 2 Score 0    Health  Maintenance Due  Topic Date Due   COVID-19 Vaccine (3 - 2023-24 season) 01/17/2022     ROS: Per HPI, otherwise a complete review of systems was negative.   PMH:  The following were reviewed and entered/updated in epic: Past Medical History:  Diagnosis Date   Appendicitis    Asthma    Crohn disease ([facility])    Periodontitis    Patient Active Problem List   Diagnosis Date Noted   GERD (gastroesophageal reflux disease) 09/03/2020   Asthma 09/03/2020   Crohn disease ([facility])    GAD (generalized anxiety disorder) 01/06/2018   Past Surgical History:  Procedure Laterality Date   APPENDECTOMY  04/23/2015   laproscopic    LAPAROSCOPIC APPENDECTOMY N/A 04/23/2015   Procedure: APPENDECTOMY LAPAROSCOPIC;  Surgeon: Jimmye Norman, MD;  Location: [facility] OR;  Service: General;  Laterality: N/A;    Family History  Problem Relation Age of Onset   Healthy Mother    Breast cancer Mother    Ovarian cancer Mother    Colon cancer Neg Hx    Esophageal cancer Neg Hx    Rectal cancer Neg Hx    Stomach cancer Neg Hx     Medications- reviewed and updated Current Outpatient Medications  Medication Sig Dispense Refill   clonazePAM (KLONOPIN) 0.5 MG tablet Take 1 tablet (0.5 mg total) by mouth 2 (two) times daily. 60 tablet 0   famotidine (PEPCID) 20 MG tablet Take 1 tablet (20 mg total) by mouth 2 (two) times daily. 60 tablet 3   fluticasone (FLONASE) 50 MCG/ACT nasal spray Place 2 sprays into both nostrils daily as needed for rhinitis (or seasonal allergies).      albuterol (PROVENTIL) (  2.5 MG/3ML) 0.083% nebulizer solution Take 3 mLs (2.5 mg total) by nebulization every 6 (six) hours as needed for wheezing or shortness of breath. 150 mL 1   beclomethasone (QVAR) 80 MCG/ACT inhaler Inhale 1 puff into the lungs 2 (two) times daily. 10.6 g 3   No current facility-administered medications for this visit.    Allergies-reviewed and updated Allergies  Allergen Reactions   Contrast Media [Iodinated Contrast  Media] Hives and Itching    Itching , hives on 04-23-15 CT exam 05-10-15---pt got 1 hr emergent prep in ED for CT -dosed with Barium--no reaction noted   Penicillins Other (See Comments)    From childhood- reaction not recalled Has patient had a PCN reaction causing immediate rash, facial/tongue/throat swelling, SOB or lightheadedness with hypotension: Unk Has patient had a PCN reaction causing severe rash involving mucus membranes or skin necrosis: Unk Has patient had a PCN reaction that required hospitalization: Unk Has patient had a PCN reaction occurring within the last 10 years: No If all of the above answers are "NO", then may proceed with Cephalosporin use.     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  Tobacco Use   Smoking status: Former    Packs/day: 0.33    Years: 14.00    Additional pack years: 0.00    Total pack years: 4.62    Types: Cigarettes    Start date: 2006    Quit date: 2020    Years since quitting: 4.3   Smokeless tobacco: Never  Vaping Use   Vaping Use: Every day   Substances: Nicotine  Substance and Sexual Activity   Alcohol use: No   Drug use: Yes    Types: Marijuana   Sexual activity: Yes  Other Topics Concern   Not on file  Social History Narrative   Not on file   Social Determinants of Health   Financial Resource Strain: Not on file  Food Insecurity: Not on file  Transportation Needs: Not on file  Physical Activity: Not on file  Stress: Not on file  Social Connections: Not on file        Objective:  Physical Exam: BP 111/67   Pulse 89   Temp 98 F (36.7 C) (Temporal)   Ht 5\' 6"  (1.676 m)   Wt 124 lb 3.2 oz (56.3 kg)   SpO2 100%   BMI 20.05 kg/m   Body mass index is 20.05 kg/m. Wt Readings from Last 3 Encounters:  09/23/22 124  lb 3.2 oz (56.3 kg)  10/24/20 120 lb 6.4 oz (54.6 kg)  10/06/19 110 lb (49.9 kg)   Gen: NAD, resting comfortably HEENT: TMs normal bilaterally. OP clear. No thyromegaly noted.  CV: RRR with no murmurs appreciated Pulm: NWOB, CTAB with no crackles, wheezes, or rhonchi GI: Normal bowel sounds present. Soft, Nontender, Nondistended. MSK: no edema, cyanosis, or clubbing noted Skin: warm, dry Neuro: CN2-12 grossly intact. Strength 5/5 in upper and lower extremities. Reflexes symmetric and intact bilaterally.  Psych: Normal affect and thought content     Lamarkus Nebel M. Jimmey Ralph, MD 09/23/2022 1:59 PM

## 2022-09-23 NOTE — Assessment & Plan Note (Signed)
Stable on albuterol and qvar as needed seasonally.  Will refill today.  Reassuring exam today.

## 2022-09-23 NOTE — Assessment & Plan Note (Signed)
Stable on clonazepam 0.5 mg twice daily as needed.  Does well with this.  No significant side effects.  Uses once or twice monthly if needed.Does not need refill today.

## 2022-09-25 NOTE — Progress Notes (Signed)
Great news!  Labs are all stable.  Do not need to make any changes to treatment plan.  He should continue to work on diet and exercise and we can recheck in a year.

## 2022-11-24 ENCOUNTER — Encounter: Payer: Self-pay | Admitting: Family Medicine

## 2022-11-24 ENCOUNTER — Telehealth (INDEPENDENT_AMBULATORY_CARE_PROVIDER_SITE_OTHER): Payer: 59 | Admitting: Family Medicine

## 2022-11-24 VITALS — Ht 66.0 in | Wt 126.0 lb

## 2022-11-24 DIAGNOSIS — K219 Gastro-esophageal reflux disease without esophagitis: Secondary | ICD-10-CM

## 2022-11-24 DIAGNOSIS — R5383 Other fatigue: Secondary | ICD-10-CM | POA: Diagnosis not present

## 2022-11-24 DIAGNOSIS — K50919 Crohn's disease, unspecified, with unspecified complications: Secondary | ICD-10-CM

## 2022-11-24 DIAGNOSIS — J4520 Mild intermittent asthma, uncomplicated: Secondary | ICD-10-CM | POA: Diagnosis not present

## 2022-11-24 NOTE — Progress Notes (Signed)
   Paul Arnold is a 42 y.o. male who presents today for a virtual office visit.  Assessment/Plan:  New/Acute Problems: Other Fatigue  Check labs.  Does have history of Crohn's disease which has been well-controlled however may interfere with his B vitamin absorption.  CBC and iron panel on recent labs were normal-do not need to recheck today.  We will check vitamin D, B12, folate, and testosterone.  We also discussed importance of sleep hygiene and aiming for at least 7 to 8 hours of sleep per night.  If labs are normal and symptoms persist would consider referral for sleep study.  Chronic Problems Addressed Today: Crohn disease ([facility]CC) Follows with GI.  No recent flares.  May be contributing to vitamin deficiency which we will be checking as above.  Asthma Stable on albuterol and Qvar as needed seasonally.  GERD (gastroesophageal reflux disease) On Pepcid 20 mg twice daily.  Will be checking B12 and folate as above.      Subjective:  HPI:  See Assessment / plan for status of chronic conditions.  Primary concern today is fatigue.  This has been going on for the last year or so.  Seems to be getting worse.  His wife wants him to have labs done.  No recent illnesses.  Otherwise is doing well.  Sleeps about 5 to 6 hours per night.  Feels tired throughout the day.       Objective/Observations  Physical Exam: Gen: NAD, resting comfortably Pulm: Normal work of breathing Neuro: Grossly normal, moves all extremities Psych: Normal affect and thought content  Virtual Visit via Video   I connected with Paul Arnold on 11/24/22 at 11:00 AM EDT by a video enabled telemedicine application and verified that I am speaking with the correct person using two identifiers. The limitations of evaluation and management by telemedicine and the availability of in person appointments were discussed. The patient expressed understanding and agreed to proceed.   Patient location: Home Provider  location: [place] [facility] [employer] Persons participating in the virtual visit: Myself and Patient     Katina Degree. Jimmey Ralph, MD 11/24/2022 11:52 AM

## 2022-11-24 NOTE — Assessment & Plan Note (Signed)
On Pepcid 20 mg twice daily.  Will be checking B12 and folate as above.

## 2022-11-24 NOTE — Assessment & Plan Note (Signed)
Stable on albuterol and Qvar as needed seasonally.

## 2022-11-24 NOTE — Assessment & Plan Note (Signed)
Follows with GI.  No recent flares.  May be contributing to vitamin deficiency which we will be checking as above.

## 2022-11-25 ENCOUNTER — Other Ambulatory Visit: Payer: Self-pay | Admitting: Family Medicine

## 2022-11-25 DIAGNOSIS — F411 Generalized anxiety disorder: Secondary | ICD-10-CM

## 2022-11-25 MED ORDER — CLONAZEPAM 0.5 MG PO TABS
0.5000 mg | ORAL_TABLET | Freq: Two times a day (BID) | ORAL | 0 refills | Status: AC
Start: 2022-11-25 — End: ?

## 2022-11-25 NOTE — Telephone Encounter (Signed)
LOV: 11/24/2022  Last refill Date: 08/13/2021  Qty: 60  Refills: 0

## 2022-11-25 NOTE — Telephone Encounter (Signed)
Please add:  albuterol (PROVENTIL) (2.5 MG/3ML) 0.083% nebulizer solution

## 2024-03-17 ENCOUNTER — Other Ambulatory Visit: Payer: Self-pay | Admitting: Family Medicine

## 2024-03-17 DIAGNOSIS — F411 Generalized anxiety disorder: Secondary | ICD-10-CM

## 2024-03-17 NOTE — Telephone Encounter (Signed)
 Copied from CRM #[id number]. Topic: Clinical - Medication Refill >> Mar 17, 2024  9:32 AM Pinkey ORN wrote: Medication: clonazePAM  (KLONOPIN ) 0.5 MG tablet  Has the patient contacted their pharmacy? No (Agent: If no, request that the patient contact the pharmacy for the refill. If patient does not wish to contact the pharmacy document the reason why and proceed with request.) (Agent: If yes, when and what did the pharmacy advise?)  This is the patient's preferred pharmacy:  [id number] - [place], [place] - [address] [place] [address] Phone: [phone] Fax: [phone]  Is this the correct pharmacy for this prescription? Yes If no, delete pharmacy and type the correct one.   Has the prescription been filled recently? No  Is the patient out of the medication? Yes  Has the patient been seen for an appointment in the last year OR does the patient have an upcoming appointment? Yes  Can we respond through MyChart? No  Agent: Please be advised that Rx refills may take up to 3 business days. We ask that you follow-up with your pharmacy.
